# Patient Record
Sex: Male | Born: 1935 | Race: White | Hispanic: No | Marital: Married | Smoking: Never smoker
Health system: Southern US, Community
[De-identification: ages and names within clinical notes are randomized; demographics above are authoritative.]

## PROBLEM LIST (undated history)

## (undated) DIAGNOSIS — I779 Disorder of arteries and arterioles, unspecified: Secondary | ICD-10-CM

## (undated) DIAGNOSIS — N183 Chronic kidney disease, stage 3 unspecified: Secondary | ICD-10-CM

## (undated) DIAGNOSIS — I739 Peripheral vascular disease, unspecified: Secondary | ICD-10-CM

## (undated) DIAGNOSIS — I10 Essential (primary) hypertension: Secondary | ICD-10-CM

## (undated) DIAGNOSIS — E785 Hyperlipidemia, unspecified: Secondary | ICD-10-CM

## (undated) HISTORY — PX: PROSTATE SURGERY: SHX751

## (undated) HISTORY — PX: KNEE SURGERY: SHX244

---

## 2011-04-01 ENCOUNTER — Observation Stay (HOSPITAL_COMMUNITY)
Admission: EM | Admit: 2011-04-01 | Discharge: 2011-04-03 | Disposition: A | Discharge status: Home / Self Care | Payer: Medicare Other | Attending: Internal Medicine | Admitting: Internal Medicine

## 2011-04-01 ENCOUNTER — Emergency Department (HOSPITAL_COMMUNITY): Payer: Medicare Other

## 2011-04-01 DIAGNOSIS — I5040 Unspecified combined systolic (congestive) and diastolic (congestive) heart failure: Secondary | ICD-10-CM | POA: Insufficient documentation

## 2011-04-01 DIAGNOSIS — R0989 Other specified symptoms and signs involving the circulatory and respiratory systems: Secondary | ICD-10-CM | POA: Insufficient documentation

## 2011-04-01 DIAGNOSIS — I509 Heart failure, unspecified: Secondary | ICD-10-CM | POA: Insufficient documentation

## 2011-04-01 DIAGNOSIS — I4891 Unspecified atrial fibrillation: Secondary | ICD-10-CM | POA: Insufficient documentation

## 2011-04-01 DIAGNOSIS — M069 Rheumatoid arthritis, unspecified: Secondary | ICD-10-CM | POA: Insufficient documentation

## 2011-04-01 DIAGNOSIS — D649 Anemia, unspecified: Principal | ICD-10-CM | POA: Insufficient documentation

## 2011-04-01 DIAGNOSIS — R0789 Other chest pain: Secondary | ICD-10-CM | POA: Insufficient documentation

## 2011-04-01 DIAGNOSIS — I059 Rheumatic mitral valve disease, unspecified: Secondary | ICD-10-CM | POA: Insufficient documentation

## 2011-04-01 DIAGNOSIS — R0609 Other forms of dyspnea: Secondary | ICD-10-CM | POA: Insufficient documentation

## 2011-04-01 DIAGNOSIS — E785 Hyperlipidemia, unspecified: Secondary | ICD-10-CM | POA: Insufficient documentation

## 2011-04-01 DIAGNOSIS — I10 Essential (primary) hypertension: Secondary | ICD-10-CM | POA: Insufficient documentation

## 2011-04-01 DIAGNOSIS — R079 Chest pain, unspecified: Secondary | ICD-10-CM

## 2011-04-01 LAB — CBC
HCT: 24.6 % — ABNORMAL LOW (ref 39.0–52.0)
HCT: 23.1 % — ABNORMAL LOW (ref 39.0–52.0)
Hemoglobin: 8.2 g/dL — ABNORMAL LOW (ref 13.0–17.0)
Hemoglobin: 7.8 g/dL — ABNORMAL LOW (ref 13.0–17.0)
MCH: 34.3 pg — ABNORMAL HIGH (ref 26.0–34.0)
MCHC: 33.3 g/dL (ref 30.0–36.0)
MCV: 102.7 fL — ABNORMAL HIGH (ref 78.0–100.0)
RBC: 2.25 MIL/uL — ABNORMAL LOW (ref 4.22–5.81)
RDW: 19.3 % — ABNORMAL HIGH (ref 11.5–15.5)
RDW: 19.5 % — ABNORMAL HIGH (ref 11.5–15.5)
WBC: 5.5 10*3/uL (ref 4.0–10.5)

## 2011-04-01 LAB — COMPREHENSIVE METABOLIC PANEL
ALT: 18 U/L (ref 0–53)
AST: 17 U/L (ref 0–37)
Albumin: 3.2 g/dL — ABNORMAL LOW (ref 3.5–5.2)
Alkaline Phosphatase: 84 U/L (ref 39–117)
Calcium: 8.6 mg/dL (ref 8.4–10.5)
GFR calc Af Amer: 57 mL/min — ABNORMAL LOW (ref >60)
Potassium: 3.4 mEq/L — ABNORMAL LOW (ref 3.5–5.1)
Sodium: 141 mEq/L (ref 135–145)
Total Protein: 5.7 g/dL — ABNORMAL LOW (ref 6.0–8.3)

## 2011-04-01 LAB — DIFFERENTIAL
Basophils Absolute: 0.1 10*3/uL (ref 0.0–0.1)
Basophils Relative: 1 % (ref 0–1)
Eosinophils Relative: 10 % — ABNORMAL HIGH (ref 0–5)
Monocytes Absolute: 0.5 10*3/uL (ref 0.1–1.0)
Monocytes Relative: 10 % (ref 3–12)
Neutro Abs: 2.6 10*3/uL (ref 1.7–7.7)

## 2011-04-01 LAB — POCT CARDIAC MARKERS
CKMB, poc: <1 ng/mL — ABNORMAL LOW (ref 1.0–8.0)
Myoglobin, poc: 54.5 ng/mL (ref 12–200)
Troponin i, poc: <0.05 ng/mL (ref 0.00–0.09)

## 2011-04-01 LAB — CARDIAC PANEL(CRET KIN+CKTOT+MB+TROPI): Relative Index: INVALID (ref 0.0–2.5)

## 2011-04-01 LAB — PROTIME-INR
INR: 1.1 (ref 0.00–1.49)
Prothrombin Time: 14.4 seconds (ref 11.6–15.2)

## 2011-04-02 DIAGNOSIS — I059 Rheumatic mitral valve disease, unspecified: Secondary | ICD-10-CM

## 2011-04-02 LAB — CBC
HCT: 23.4 % — ABNORMAL LOW (ref 39.0–52.0)
MCH: 34.5 pg — ABNORMAL HIGH (ref 26.0–34.0)
MCV: 103.5 fL — ABNORMAL HIGH (ref 78.0–100.0)
RDW: 19.8 % — ABNORMAL HIGH (ref 11.5–15.5)
WBC: 5.1 10*3/uL (ref 4.0–10.5)

## 2011-04-02 LAB — COMPREHENSIVE METABOLIC PANEL
BUN: 18 mg/dL (ref 6–23)
Calcium: 8.6 mg/dL (ref 8.4–10.5)
Glucose, Bld: 77 mg/dL (ref 70–99)
Sodium: 143 mEq/L (ref 135–145)
Total Protein: 5.1 g/dL — ABNORMAL LOW (ref 6.0–8.3)

## 2011-04-02 LAB — CARDIAC PANEL(CRET KIN+CKTOT+MB+TROPI)
CK, MB: 1 ng/mL (ref 0.3–4.0)
Relative Index: INVALID (ref 0.0–2.5)
Total CK: 23 U/L (ref 7–232)
Troponin I: 0.02 ng/mL (ref 0.00–0.06)

## 2011-04-02 LAB — IRON AND TIBC
Iron: 47 ug/dL (ref 42–135)
Saturation Ratios: 25 % (ref 20–55)
TIBC: 185 ug/dL — ABNORMAL LOW (ref 215–435)
UIBC: 138 ug/dL

## 2011-04-02 LAB — LIPID PANEL
LDL Cholesterol: 43 mg/dL (ref 0–99)
Total CHOL/HDL Ratio: 2.8 RATIO
Triglycerides: 81 mg/dL (ref <150)
VLDL: 16 mg/dL (ref 0–40)

## 2011-04-02 LAB — FERRITIN: Ferritin: 446 ng/mL — ABNORMAL HIGH (ref 22–322)

## 2011-04-02 LAB — MAGNESIUM: Magnesium: 2.2 mg/dL (ref 1.5–2.5)

## 2011-04-02 LAB — VITAMIN B12: Vitamin B-12: 345 pg/mL (ref 211–911)

## 2011-04-03 LAB — CROSSMATCH
Antibody Screen: NEGATIVE
Unit division: 0

## 2011-04-03 LAB — HEMOGLOBIN AND HEMATOCRIT, BLOOD: Hemoglobin: 10.2 g/dL — ABNORMAL LOW (ref 13.0–17.0)

## 2011-04-03 NOTE — Consult Note (Signed)
NAMEGIOVONI, BUNCH             ACCOUNT NO.:  [id number]  MEDICAL RECORD NO.:  [id number]           PATIENT TYPE:  I  LOCATION:  2010                         FACILITY:  [facility]  PHYSICIAN:  Noralyn Pick. Eden Emms, MD, FACCDATE OF BIRTH:  12/14/1930  DATE OF CONSULTATION:  04/01/2011 DATE OF DISCHARGE:                                CONSULTATION   Mr. Sites is a 77 year old patient, new to the [facility].  He previously had lived in [place], [place].  He recently moved to [place] and sees Dr. Jacqulyn Bath as his primary.  About 8 weeks ago, he had angioedema and was treated with prednisone and Benadryl.  It is not clear to me what would particular ACE inhibitor he was on.  His medications that his wife had with him still has losartan in it.  This seems to have resolved.  He indicates having some heart problem in the past 6 months that required an 8-day hospital stay at [facility].  We do not have these records.  He did not have a heart cath. In talking to the patient, his memory is poor and almost appears that he has some dementia.  He came to the emergency room with fatigue and weakness.  He also complains of some atypical sharp central chest pressure and the pressure is nonexertional.  There is no associated diaphoresis.  His fatigue and dyspnea has been somewhat progressive over the last few weeks.  The patient denies syncope.  He was bradycardic in the emergency room, but is on a beta-blocker.  He denies palpitations.  He has had some sort of stomach surgery as a child, but denies knowing any history of anemia.  The ER doctor guaiac active stools and they were negative.  He had profound anemia in the emergency room with a hematocrit of 24.6 and an MCV of 102.9.  His 10-point review of systems is otherwise negative.  His past medical history is remarkable for hypertension, hypercholesterolemia, question of heart problems requiring hospitalization at  [facility].  The patient and his new wife are really uninformed regarding what it was and the patient has recent angioedema.  The patient was just remarried this Saturday.  He is a retired [employer] architect.  He lives in [place] now.  He indicates being able to walk on a regular basis prior to the last couple of weeks when he started to feel fatigued.  He does not smoke or drink.  He was in [place] previously.  FAMILY HISTORY:  Noncontributory.  There is no premature coronary artery disease in his mother's or father's side.  The patient describes an allergy to ether.  His medications which he brought with him included, 1. Toprol 25 mg a day. 2. Lipitor 20 mg a day. 3. Omeprazole 20 mg a day. 4. Losartan 15 mg unclear if he is still taking this after his bout of     angioedema. 5. Lasix 40 mg a day. 6. Meloxicam 15 mg a day. 7. Methotrexate 2.5 mg a day.  PHYSICAL EXAMINATION:  GENERAL:  Remarkable for an elderly male with poor memory. VITAL SIGNS:  His blood pressure is 138/72, pulse is 52 and sinus bradycardia, respiratory rate 14, afebrile. HEENT:  Unremarkable.  Carotids normal without bruit.  No lymphadenopathy, no thyromegaly, or JVP elevation. LUNGS:  Clear.  Good diaphragmatic motion.  No wheezing. CARDIAC:  S1 and S2 with a soft systolic murmur.  PMI palpable, but not particularly enlarged. ABDOMEN:  Somewhat tender.  He has a scar from his childhood surgery. No AAA palpable.  No hepatosplenomegaly or hepatojugular reflux, or tenderness. EXTREMITIES:  Distal pulses are intact.  No edema. NEURO:  Nonfocal. SKIN:  Warm and dry.  No muscular weakness.  EKG shows sinus rhythm at a rate of 44 and is otherwise totally normal.  Blood counts shows sodium 141, potassium 3.4, BUN 18, creatinine 1.45. Essentially normal LFTs.  Occult blood was negative.  Cardiac markers were negative.  White count 5.0, hematocrit 24.6, platelet count 356.  Chest x-ray shows  really no significant cardiac enlargement, normal lung fields, no evidence of congestive heart failure.  IMPRESSION: 1. Somewhat atypical chest pain with a normal EKG and negative cardiac     markers.  His pain may be result of his severe anemia.  We will     have to get records from [facility] regarding his hospital     stay there.  There is no evidence on exam or by chest x-ray of     significant decreased LV function.  It is unclear to me whether he     was on losartan and Lasix for cardiomyopathy or his hypertension.     We will rule him, get records from [facility].  We will     check a BNP and a sed rate and 2-D echocardiogram in the morning. 2. Severe anemia.  Workup per primary team.  He is guaiac negative and     has a high MCV.  He denies alcohol intake.  Again I suspect with     his severe degree of anemia, this is part of the reason for his     fatigue and shortness of breath and may be causing chest pain.  He     has had previous abdominal surgery as a child, but he does not say     that he has a history of chronic anemia. 3. Hypertension.  Given his history of angioedema, I suspect that any     high blood pressure can be treated with Norvasc.  We will stop his     Toprol due to his bradycardia.  I would avoid AV nodal blocking     drugs and ACE inhibitors given his angioedema.  Depending on his LV     function by echo, low-dose diuretics may be in order.  We would be happy to follow this patient along in the hospital to further workup his heart and records from [facility] will be important.    Noralyn Pick. Eden Emms, MD, [facility]    PCN/MEDQ  D:  04/01/2011  T:  04/02/2011  Job:  [id number]  Electronically Signed by Charlton Haws MD [facility] on 04/03/2011 03:36:43 PM

## 2011-04-07 NOTE — Discharge Summary (Signed)
[name], [name]             ACCOUNT NO.:  [id number]  MEDICAL RECORD NO.:  [id number]           PATIENT TYPE:  I  LOCATION:  2010                         FACILITY:  [facility]  PHYSICIAN:  Jeoffrey Massed, MD    DATE OF BIRTH:  02-12-1930  DATE OF ADMISSION:  04/01/2011 DATE OF DISCHARGE:                        DISCHARGE SUMMARY - REFERRING   PRIMARY DISCHARGE DIAGNOSES: 1. Severe anemia status post 2 units of packed red blood cell     transfusion. 2. Atypical chest pain. 3. Exertional dyspnea, probably secondary to symptomatic anemia. 4. Moderate mitral regurgitation.  SECONDARY DISCHARGE DIAGNOSES: 1. Congestive heart failure, combined systolic and diastolic     dysfunction. 2. Paroxysmal atrial fibrillation maintained on amiodarone.  Not on     anticoagulation prior to this hospitalization. 3. History of rheumatoid arthritis. 4. Hypertension. 5. Dyslipidemia. 6. Allergic rhinitis.  DISCHARGE MEDICATIONS: 1. Amiodarone 200 mg 1 tablet twice daily. 2. Aspirin 81 mg 1 tablet daily. 3. Lipitor 20 mg 1 tablet p.o. daily. 4. Cetirizine 10 mg 1 tablet daily. 5. Diphenhydramine 25 mg 1 tablet p.o. twice daily p.r.n. 6. Lasix 40 mg 1 tablet daily. 7. Lidocaine 2% 2 teaspoons p.o. q. hour p.r.n. for pain. 8. Losartan 50 mg 1 tablet daily. 9. Methotrexate 2.5 mg 6 tablets p.o. weekly. 10.Omeprazole 20 mg 1 tablet p.o. daily. 11.Folic acid 1 mg p.o. daily.  CONSULTATIONS: 1. Noralyn Pick. Eden Emms, MD, [facility] from [facility]. 2. John C. Madilyn Fireman, MD from [facility].  BRIEF HISTORY OF PRESENT ILLNESS:  The patient is a 77 year old male with a past medical history of proximal atrial fibrillation not on any anticoagulation prior to this hospitalization, however, in the past few months, was on Xarelto that was stopped, hypertension, rheumatoid arthritis, and dyslipidemia, came in to the hospital on April 01, 2011, with chest pain.  The patient was also found to be anemic.  He  also retrospectively gave a history of feeling weak and fatigued and having dyspnea on exertion.  He was then admitted to the Hospitalist Service for further evaluation and treatment.  For further details, please see the history and physical that was dictated by Dr. Toniann Fail on admission.  PERTINENT LABORATORY DATA: 1. Discharge hemoglobin is 10.2. 2. Anemia panel showed an iron of 47, total iron binding capacity of     185, percent saturation of 25, vitamin B12 of 345, folate of 3.2,     ferritin of 446. 3. Cardiac enzymes were cycled and these were negative. 4. LDL cholesterol was 43. 5. Fecal occult blood was negative.  PERTINENT RADIOLOGICAL STUDIES:  Portable chest x-ray shows minimal enlargement of the cardiac silhouette.  No pulmonary edema, pneumonia, or pleural effusion is seen.  No pneumothorax or skeletal lesion is seen.  A 2-D echocardiogram showed systolic function to be mildly to moderately reduced with an EF around 40-45%.  The posteroanterior lateral walls appeared moderately hypokinetic.  Features are consistent with pseudonormal left ventricular filling pattern, with concomitant abdominal relaxation and increased filling pressures.  There was at least moderate and possibly severe posteriorly directed eccentric mitral regurgitation.  BRIEF HOSPITAL COURSE: 1. Chest pain.  The patient was  admitted to the Hospitalist Service     with chest pain.  Cardiology also consulted on this patient.  The     patient was started on aspirin.  His beta-blocker was placed on     hold because of bradycardia.  His statin was continued.  It was     felt that the chest pain was atypical in nature.  A 2-D     echocardiogram was done which was noted as above.  Dr. Eden Emms from     Cardiology followed on this patient and he recommends further     workup to be done as an outpatient.  So at this time, all workup is     being deferred to the patient's primary cardiologist. 2. Anemia.   The patient presented to the hospital with a hemoglobin of     8.2 with an MCV of 102.9.  During his hospital course, his     hemoglobin did decrease to 7.8.  Because of the fact that he was     symptomatic and also of his significant cardiac history, he was     transfused 2 units of packed red blood cells.  Please note that his     FOBT and guaiac were negative on admission.  The patient's medical     records from [facility] was sought and upon     further review, it was found that he had a normal hemoglobin level     in December of last year.  Upon further questioning of this     patient, he claims that he gets intermittent hematochezia.  Also     further review of the documentation from [facility] also     showed that at one point, he was anticoagulated with Xarelto.     However, apparently this was discontinued a few weeks or months ago     for unknown reason.  The patient also is on nonsteroidal anti-     inflammatory medications for his arthritis.  An anemia panel was     done which shows a normal iron level, but a decreased folate level     of 3.2.  GI was consulted because of the quick and significant drop     in the past few months and the patient's history of intermittent     hematochezia.  Dr. Madilyn Fireman from [facility] consulted on this patient     and at this point, the patient wants to be discharged and he will     apparently follow up with Dr. Madilyn Fireman who will arrange for an     outpatient colonoscopy and EGD.  At this time, this patient has     been cleared by both GI and Cardiology to be discharged home.     Again, please note that there is no active blood loss and his     guaiac and a fecal occult blood both are negative. 3. CHF.  This is combined systolic and diastolic.  The patient is     clinically compensated.  He is to continue his Lasix and losartan.     His beta-blocker has been discontinued at the recommendation of     Cardiology for significant  bradycardia. 4. History of paroxysmal atrial fibrillation.  The patient was sinus     during this hospitalization.  Given his significant anemia and     question of perhaps a recent GI bleed, anticoagulation is obviously     not recommended at this point  in time.  He will just be discharged     on a baby aspirin for now.  He will follow up with GI and     Cardiology and after appropriate workup, the issue of     anticoagulation will then need to be revisited at that point.  For     now, we will defer this issue until the issue of anemia has been     adequate addressed. 5. History of rheumatoid arthritis.  This is stable.  The patient is     on methotrexate.  DISPOSITION:  The patient is felt stable to be discharged home.  FOLLOWUP INSTRUCTIONS: 1. The patient will follow up with Dr. Charlton Haws from [facility]     Cardiology.  He is to call and make an appointment within 1-2     weeks. 2. The patient is to follow up with Dr. Madilyn Fireman for an outpatient     endoscopy and colonoscopy.  He is to call [phone] in 2-3 weeks to     make an appointment. 3. The patient's spouse is at bedside and she tells me that she will     make arrangements for the patient to be seen by Dr. Holley Bouche,     who is also her primary physician for his primary care needs.  She     claims that she will make an appointment today and have the patient     see Dr. Tiburcio Pea in the next few days as well.  The patient has been instructed by me numerous times to seek immediate medical attention or talk to his doctors or present to the ED if he continues to have significant rectal bleeding.  He and his spouse both claim understanding.  TOTAL TIME SPENT:  Forty five minutes.     Jeoffrey Massed, MD     SG/MEDQ  D:  04/03/2011  T:  04/03/2011  Job:  [id number]  cc:   Holley Bouche, M.D. Noralyn Pick. Eden Emms, MD, [facility] C. Madilyn Fireman, M.D.  Electronically Signed by Jeoffrey Massed  on 04/07/2011 08:13:12 PM

## 2011-05-03 NOTE — H&P (Signed)
NAME:  [name], [name] NO.:  [id number]  MEDICAL RECORD NO.:  [id number]           PATIENT TYPE:  E  LOCATION:  [facility]                         FACILITY:  [facility]  PHYSICIAN:  Eduard Clos, MDDATE OF BIRTH:  May 20, 1930  DATE OF ADMISSION:  04/01/2011 DATE OF DISCHARGE:                             HISTORY & PHYSICAL   PRIMARY CARE PHYSICIAN:  At [facility].  The patient is planning to change PCP to [facility] [place].  PRIMARY CARDIOLOGIST:  Dr. Charlton Haws at [facility].  CHIEF COMPLAINT:  Chest pain.  HISTORY OF PRESENTING ILLNESS:  A 77 year old male with known history of hypertension, CHF unspecified, arthritis, is on methotrexate, hyperlipidemia, has been experiencing some chest pain since afternoon. The pain is retrosternal, pressure-like, sometimes radiating to left, increases with exertion.  Over the last few days, the patient has been also feeling fatigued and weak with dizziness when he tries to exert. The patient did not lose consciousness.  He said he almost passed out a couple of times.  Denies any cough or phlegm.  Denies any fever or chills.  Denies any abdominal pain, dysuria, discharge, or diarrhea. Denies any headache or visual symptoms.  Denies any focal deficit.  The ER physician had a consult with on-call cardiologist who advised medical admission at this time and also we need further workup on his anemia.  The patient does not recall having any in the stools or black stools. The patient's guaiac is negative at this time.  PAST MEDICAL HISTORY: 1. Hypertension. 2. CHF unspecified. 3. Hyperlipidemia. 4. Allergic rhinitis. 5. Arthritis.  PAST SURGICAL HISTORY:  The patient has had knee replacement, cataract surgery, and facial surgery for injury during football game when he was a teenager.  The patient denies having any stents in the heart or surgery for the heart or gallbladder or appendix.  The patient did  have surgery for prostate cancer.  MEDICATIONS PER ADMISSION: 1. Furosemide 40 mg daily. 2. Meloxicam 15 mg daily. 3. Omeprazole 20 mg daily. 4. Losartan 50 mg daily. 5. Metoprolol XL 25 mg daily. 6. Atorvastatin 20 mg daily. 7. Methotrexate 2.5 mg 6 tablets weekly. 8. Cetirizine 10 mg daily. 9. Diphenhydramine 25 mg 1 tablet twice daily. 10.Lidocaine 2% p.o.  ALLERGIES:  No known drug allergies.  FAMILY HISTORY:  Nothing contributory.  SOCIAL HISTORY:  The patient quit smoking 15 years ago.  Denies any alcohol or drug abuse.  He is married.  He is a full code.  REVIEW OF SYSTEMS:  As per history of present illness, nothing else significant.  PHYSICAL EXAMINATION:  GENERAL:  The patient examined at bedside, not in acute distress.  Chest pain is largely improved at this time but still has mild. VITAL SIGNS:  Blood pressure 123/60, pulse 44 per minute, sinus bradycardia, temperature 97.4, respirations 18 per minute, O2 sat 100%. HEENT: Anicteric.  No pallor.  No discharge from ears, eyes, nose, or mouth. CHEST:  Bilateral air entry present.  No rhonchi.  No crepitation. HEART:  S1, S2 heard. ABDOMEN:  Soft, nontender.  Bowel sounds heard. CNS:  The patient is alert, awake, and oriented to  time, place, and person, moves upper and lower extremities, 5/5. EXTREMITIES:  Peripheral pulses felt.  No edema.  LABORATORY DATA:  EKG shows normal sinus rhythm with nonspecific ST-T changes with heart rate of 44 beats per minute.  Chest x-ray shows minimal enlargement of cardiac silhouette.  No pulmonary edema, pneumonia, or pleural effusion is seen.  No pneumothorax or skeletal lesion is seen.  CBC:  WBCs 5, hemoglobin 8.2, hematocrit is 24.6, platelets 56, MCV is 102.9.  Eosinophil is 10%.  PT/INR 14.4 and 1.1. Complete metabolic panel; sodium 141, potassium 3.4, chloride 111, carbon dioxide 26, glucose 112, BUN 18, creatinine 1.4, total bilirubin 7.4, alkaline phos is 84, AST 17,  ALT 18, total protein is 5.7, albumin 3.2, calcium is 8.6, CK-MB less than 1, troponin-I less than 0.05, hemoglobin 61.2, fecal occult blood is negative.  ASSESSMENT: 1. Chest pain at this time concerning for unstable angina. 2. Dizziness with sinus bradycardia and anemia. 3. Sinus bradycardia. 4. Macrocytic anemia with fecal blood is negative. 5. History of hypertension. 6. History of allergic rhinitis. 7. Eosinophilia. 8. Previous history of cigarette smoking. 9. Arthritis.  PLAN: 1. Admit the patient to telemetry. 2. For chest pain we will cycle cardiac markers.  The patient on     aspirin, nitroglycerin paste.  We will get a 2-D echo and also     consult Cardiology. 3. For his dizziness and at this time the patient's bradycardia, we     will hold his metoprolol and will check orthostatics.  At this time     the patient also has anemia which could be contributing to his     dizziness.  Guaiac is negative.  I am going to recheck a CBC in     another few hours to make sure there is no significant drop.  Also,     check anemia panel.  We will type and screen 2 units and hold.     Probably the patient may need a GI workup also as the patient is on     antiinflammatory medication for his arthritis. 4. Eosinophilia with a history of allergic rhinitis.  We will continue     his cetirizine for now. 5. He is on methotrexate and does not know exactly what he is taking     that for, he is saying probably for arthritis.  We need to get his     medical records from his PCP. 6. Further recommendation will be based on test order, clinic course,     and consult recommendations.     Eduard Clos, MD     ANK/MEDQ  D:  04/01/2011  T:  04/01/2011  Job:  [id number]  cc:   Noralyn Pick. Eden Emms, MD, [facility]  Electronically Signed by Midge Minium MD on 05/03/2011 08:04:20 AM

## 2011-05-06 NOTE — Consult Note (Signed)
  [name], [name]             ACCOUNT NO.:  [id number]  MEDICAL RECORD NO.:  [id number]           PATIENT TYPE:  I  LOCATION:  2010                         FACILITY:  [facility]  PHYSICIAN:  Halvor Behrend C. Madilyn Fireman, M.D.    DATE OF BIRTH:  December 05, 1930  DATE OF CONSULTATION:  04/03/2011 DATE OF DISCHARGE:  04/03/2011                                CONSULTATION   REASON FOR CONSULTATION:  Anemia with reported small-volume hematochezia.  HISTORY OF PRESENT ILLNESS:  The patient is a 77 year old white male with hypertension, arthritis on methotrexate who presented with chest pain, was found to have a hemoglobin of 7.8.  He had heme-negative stools who states that he has had intermittent bright red blood per rectum.  He thinks he has had a colonoscopy in the past but is not sure of the detail and is sure that it has been over 5 years ago.  He reportedly had a hemoglobin of 14 a few months ago.  He denies any nausea, vomiting, dysphagia, anorexia, change in stool caliber.  He has no family history of GI malignancy.  He has been cleared from cardiac standpoint for discharge and outpatient followup.  He has received 2 units of packed red blood cells while in the hospital.  PAST MEDICAL HISTORY:  Hypertension, congestive heart failure, hyperlipidemia, allergic rhinitis, arthritis.  PAST SURGICAL HISTORY:  Knee replacement, cataract surgery, facial surgery.  MEDICATIONS:  Furosemide, meloxicam, omeprazole, losartan, metoprolol, atorvastatin, methotrexate.  ALLERGIES:  None.  FAMILY HISTORY:  Noncontributory.  SOCIAL HISTORY:  The patient denies tobacco use for the last 15 years. He does not drink alcohol.  PHYSICAL EXAMINATION:  GENERAL:  Well-developed, well-nourished white male, in no acute distress. HEART:  Regular rate and rhythm without murmurs. LUNGS:  Clear. ABDOMEN:  Soft, nondistended with normoactive bowel sounds.  No hepatosplenomegaly, mass, or guarding.  LABS:  Sed rate 78,  hemoglobin 8.2 following 7.8, MCV 102.7, platelets 360,000.  BUN 18, creatinine 1.45.  IMPRESSION:  Anemia with significant drop in hemoglobin without overt evidence of GI bleeding with intermittent hematochezia and no recent colon screening.  He also takes meloxicam for arthritis, but takes proton pump inhibitor as well.  PLAN:  The patient should have colonoscopy.  He reported to have this done as an outpatient and if cleared from medical standpoint, would like to go home.  We will set up outpatient followup for colonoscopy and possibly EGD.          ______________________________ Everardo All. Madilyn Fireman, M.D.     JCH/MEDQ  D:  04/03/2011  T:  04/03/2011  Job:  [id number]  Electronically Signed by Dorena Cookey M.D. on 05/06/2011 08:52:24 PM

## 2011-08-18 ENCOUNTER — Inpatient Hospital Stay (HOSPITAL_COMMUNITY)
Admission: AD | Admit: 2011-08-18 | Discharge: 2011-09-05 | DRG: 640 | Disposition: A | Discharge status: Home / Self Care | Payer: Medicare Other | Source: Ambulatory Visit | Attending: Internal Medicine | Admitting: Internal Medicine

## 2011-08-18 ENCOUNTER — Inpatient Hospital Stay (HOSPITAL_COMMUNITY): Payer: Medicare Other

## 2011-08-18 DIAGNOSIS — N189 Chronic kidney disease, unspecified: Secondary | ICD-10-CM | POA: Diagnosis present

## 2011-08-18 DIAGNOSIS — R300 Dysuria: Secondary | ICD-10-CM | POA: Diagnosis present

## 2011-08-18 DIAGNOSIS — R7401 Elevation of levels of liver transaminase levels: Secondary | ICD-10-CM | POA: Diagnosis present

## 2011-08-18 DIAGNOSIS — R5381 Other malaise: Secondary | ICD-10-CM | POA: Diagnosis present

## 2011-08-18 DIAGNOSIS — I5032 Chronic diastolic (congestive) heart failure: Secondary | ICD-10-CM | POA: Diagnosis present

## 2011-08-18 DIAGNOSIS — S3994XA Unspecified injury of external genitals, initial encounter: Secondary | ICD-10-CM | POA: Diagnosis not present

## 2011-08-18 DIAGNOSIS — I509 Heart failure, unspecified: Secondary | ICD-10-CM | POA: Diagnosis present

## 2011-08-18 DIAGNOSIS — S39848A Other specified injuries of external genitals, initial encounter: Secondary | ICD-10-CM | POA: Diagnosis not present

## 2011-08-18 DIAGNOSIS — N179 Acute kidney failure, unspecified: Secondary | ICD-10-CM | POA: Diagnosis present

## 2011-08-18 DIAGNOSIS — I129 Hypertensive chronic kidney disease with stage 1 through stage 4 chronic kidney disease, or unspecified chronic kidney disease: Secondary | ICD-10-CM | POA: Diagnosis present

## 2011-08-18 DIAGNOSIS — J189 Pneumonia, unspecified organism: Secondary | ICD-10-CM | POA: Diagnosis present

## 2011-08-18 DIAGNOSIS — M069 Rheumatoid arthritis, unspecified: Secondary | ICD-10-CM | POA: Diagnosis present

## 2011-08-18 DIAGNOSIS — J984 Other disorders of lung: Secondary | ICD-10-CM | POA: Diagnosis present

## 2011-08-18 DIAGNOSIS — R7402 Elevation of levels of lactic acid dehydrogenase (LDH): Secondary | ICD-10-CM | POA: Diagnosis present

## 2011-08-18 DIAGNOSIS — N170 Acute kidney failure with tubular necrosis: Secondary | ICD-10-CM | POA: Diagnosis present

## 2011-08-18 DIAGNOSIS — Z8546 Personal history of malignant neoplasm of prostate: Secondary | ICD-10-CM

## 2011-08-18 DIAGNOSIS — D638 Anemia in other chronic diseases classified elsewhere: Secondary | ICD-10-CM | POA: Diagnosis present

## 2011-08-18 DIAGNOSIS — E785 Hyperlipidemia, unspecified: Secondary | ICD-10-CM | POA: Diagnosis present

## 2011-08-18 DIAGNOSIS — X58XXXA Exposure to other specified factors, initial encounter: Secondary | ICD-10-CM | POA: Diagnosis not present

## 2011-08-18 DIAGNOSIS — I4891 Unspecified atrial fibrillation: Secondary | ICD-10-CM | POA: Diagnosis present

## 2011-08-18 LAB — CARDIAC PANEL(CRET KIN+CKTOT+MB+TROPI)
Relative Index: INVALID (ref 0.0–2.5)
Troponin I: <0.3 ng/mL (ref <0.30)

## 2011-08-18 LAB — VITAMIN B12: Vitamin B-12: 1045 pg/mL — ABNORMAL HIGH (ref 211–911)

## 2011-08-18 LAB — URINALYSIS, ROUTINE W REFLEX MICROSCOPIC
Glucose, UA: NEGATIVE mg/dL
Hgb urine dipstick: NEGATIVE
Ketones, ur: NEGATIVE mg/dL
Leukocytes, UA: NEGATIVE
Protein, ur: NEGATIVE mg/dL
pH: 6 (ref 5.0–8.0)

## 2011-08-18 LAB — RAPID URINE DRUG SCREEN, HOSP PERFORMED
Amphetamines: NOT DETECTED
Barbiturates: NOT DETECTED
Benzodiazepines: NOT DETECTED
Cocaine: NOT DETECTED
Tetrahydrocannabinol: NOT DETECTED

## 2011-08-18 LAB — CBC
HCT: 26.5 % — ABNORMAL LOW (ref 39.0–52.0)
Hemoglobin: 8.8 g/dL — ABNORMAL LOW (ref 13.0–17.0)
MCV: 101.1 fL — ABNORMAL HIGH (ref 78.0–100.0)
RBC: 2.62 MIL/uL — ABNORMAL LOW (ref 4.22–5.81)
RDW: 15.8 % — ABNORMAL HIGH (ref 11.5–15.5)
WBC: 5.1 10*3/uL (ref 4.0–10.5)

## 2011-08-18 LAB — COMPREHENSIVE METABOLIC PANEL
ALT: 123 U/L — ABNORMAL HIGH (ref 0–53)
AST: 99 U/L — ABNORMAL HIGH (ref 0–37)
Alkaline Phosphatase: 223 U/L — ABNORMAL HIGH (ref 39–117)
CO2: 31 mEq/L (ref 19–32)
Calcium: 13.8 mg/dL (ref 8.4–10.5)
Chloride: 98 mEq/L (ref 96–112)
GFR calc Af Amer: 39 mL/min — ABNORMAL LOW (ref >60)
GFR calc non Af Amer: 32 mL/min — ABNORMAL LOW (ref >60)
Glucose, Bld: 94 mg/dL (ref 70–99)
Sodium: 137 mEq/L (ref 135–145)
Total Bilirubin: 0.8 mg/dL (ref 0.3–1.2)

## 2011-08-18 LAB — TSH: TSH: 0.647 u[IU]/mL (ref 0.350–4.500)

## 2011-08-18 LAB — SEDIMENTATION RATE: Sed Rate: 119 mm/hr — ABNORMAL HIGH (ref 0–16)

## 2011-08-19 ENCOUNTER — Inpatient Hospital Stay (HOSPITAL_COMMUNITY): Payer: Medicare Other

## 2011-08-19 LAB — CARDIAC PANEL(CRET KIN+CKTOT+MB+TROPI)
Total CK: 16 U/L (ref 7–232)
Troponin I: <0.3 ng/mL (ref <0.30)

## 2011-08-19 LAB — TECHNOLOGIST SMEAR REVIEW

## 2011-08-19 LAB — COMPREHENSIVE METABOLIC PANEL
ALT: 100 U/L — ABNORMAL HIGH (ref 0–53)
AST: 72 U/L — ABNORMAL HIGH (ref 0–37)
Alkaline Phosphatase: 205 U/L — ABNORMAL HIGH (ref 39–117)
BUN: 36 mg/dL — ABNORMAL HIGH (ref 6–23)
CO2: 29 mEq/L (ref 19–32)
Calcium: 12.7 mg/dL — ABNORMAL HIGH (ref 8.4–10.5)
Chloride: 103 mEq/L (ref 96–112)
Creatinine, Ser: 1.63 mg/dL — ABNORMAL HIGH (ref 0.50–1.35)
Creatinine, Ser: 1.56 mg/dL — ABNORMAL HIGH (ref 0.50–1.35)
GFR calc Af Amer: 50 mL/min — ABNORMAL LOW (ref >60)
GFR calc non Af Amer: 41 mL/min — ABNORMAL LOW (ref >60)
Glucose, Bld: 106 mg/dL — ABNORMAL HIGH (ref 70–99)
Sodium: 140 mEq/L (ref 135–145)
Total Bilirubin: 0.8 mg/dL (ref 0.3–1.2)
Total Protein: 5.9 g/dL — ABNORMAL LOW (ref 6.0–8.3)

## 2011-08-19 LAB — CBC
HCT: 26.9 % — ABNORMAL LOW (ref 39.0–52.0)
Hemoglobin: 9.2 g/dL — ABNORMAL LOW (ref 13.0–17.0)
MCV: 100 fL (ref 78.0–100.0)
RBC: 2.69 MIL/uL — ABNORMAL LOW (ref 4.22–5.81)
RDW: 15.7 % — ABNORMAL HIGH (ref 11.5–15.5)
WBC: 5.2 10*3/uL (ref 4.0–10.5)

## 2011-08-19 MED ORDER — TECHNETIUM TC 99M MEDRONATE IV KIT
24.1000 | PACK | Freq: Once | INTRAVENOUS | Status: AC | PRN
  Administered 2011-08-19: 24.1 via INTRAVENOUS

## 2011-08-20 ENCOUNTER — Inpatient Hospital Stay (HOSPITAL_COMMUNITY): Payer: Medicare Other

## 2011-08-20 DIAGNOSIS — D649 Anemia, unspecified: Secondary | ICD-10-CM

## 2011-08-20 LAB — CBC
HCT: 23.3 % — ABNORMAL LOW (ref 39.0–52.0)
Hemoglobin: 8.1 g/dL — ABNORMAL LOW (ref 13.0–17.0)
Hemoglobin: 8.1 g/dL — ABNORMAL LOW (ref 13.0–17.0)
MCH: 33.3 pg (ref 26.0–34.0)
MCH: 34 pg (ref 26.0–34.0)
MCHC: 34.2 g/dL (ref 30.0–36.0)
MCHC: 34.3 g/dL (ref 30.0–36.0)
MCV: 99.6 fL (ref 78.0–100.0)
MCV: 100.4 fL — ABNORMAL HIGH (ref 78.0–100.0)
Platelets: 288 10*3/uL (ref 150–400)
Platelets: 266 10*3/uL (ref 150–400)
RBC: 2.43 MIL/uL — ABNORMAL LOW (ref 4.22–5.81)
RDW: 15.6 % — ABNORMAL HIGH (ref 11.5–15.5)
RDW: 15.5 % (ref 11.5–15.5)
WBC: 4.5 10*3/uL (ref 4.0–10.5)

## 2011-08-20 LAB — COMPREHENSIVE METABOLIC PANEL
ALT: 84 U/L — ABNORMAL HIGH (ref 0–53)
AST: 55 U/L — ABNORMAL HIGH (ref 0–37)
CO2: 28 mEq/L (ref 19–32)
Calcium: 12.5 mg/dL — ABNORMAL HIGH (ref 8.4–10.5)
Chloride: 104 mEq/L (ref 96–112)
Creatinine, Ser: 1.5 mg/dL — ABNORMAL HIGH (ref 0.50–1.35)
GFR calc Af Amer: 55 mL/min — ABNORMAL LOW (ref >60)
GFR calc non Af Amer: 45 mL/min — ABNORMAL LOW (ref >60)
Glucose, Bld: 92 mg/dL (ref 70–99)
Sodium: 139 mEq/L (ref 135–145)
Total Bilirubin: 0.8 mg/dL (ref 0.3–1.2)

## 2011-08-20 LAB — UIFE/LIGHT CHAINS/TP QN, 24-HR UR
Beta, Urine: DETECTED — AB
Free Kappa Lt Chains,Ur: 5.32 mg/dL — ABNORMAL HIGH (ref 0.14–2.42)
Free Lambda Lt Chains,Ur: 1.26 mg/dL — ABNORMAL HIGH (ref 0.02–0.67)
Gamma Globulin, Urine: DETECTED — AB

## 2011-08-20 LAB — URINALYSIS, ROUTINE W REFLEX MICROSCOPIC
Bilirubin Urine: NEGATIVE
Glucose, UA: NEGATIVE mg/dL
Ketones, ur: NEGATIVE mg/dL
Leukocytes, UA: NEGATIVE
Protein, ur: NEGATIVE mg/dL
pH: 7 (ref 5.0–8.0)

## 2011-08-20 LAB — DIFFERENTIAL
Eosinophils Absolute: 0.3 10*3/uL (ref 0.0–0.7)
Eosinophils Relative: 6 % — ABNORMAL HIGH (ref 0–5)
Lymphocytes Relative: 14 % (ref 12–46)
Lymphs Abs: 0.7 10*3/uL (ref 0.7–4.0)
Monocytes Absolute: 0.1 10*3/uL (ref 0.1–1.0)

## 2011-08-20 LAB — HEPATITIS PANEL, ACUTE
HCV Ab: NEGATIVE
Hep A IgM: NEGATIVE
Hep B C IgM: NEGATIVE

## 2011-08-20 LAB — PROTEIN ELECTROPHORESIS, SERUM
Albumin ELP: 55.3 % — ABNORMAL LOW (ref 55.8–66.1)
Alpha-1-Globulin: 7.8 % — ABNORMAL HIGH (ref 2.9–4.9)
Beta 2: 6.3 % (ref 3.2–6.5)

## 2011-08-21 ENCOUNTER — Inpatient Hospital Stay (HOSPITAL_COMMUNITY): Payer: Medicare Other

## 2011-08-21 LAB — UIFE/LIGHT CHAINS/TP QN, 24-HR UR
Alpha 2, Urine: DETECTED — AB
Beta, Urine: DETECTED — AB
Free Kappa Lt Chains,Ur: 8.68 mg/dL — ABNORMAL HIGH (ref 0.14–2.42)
Free Kappa/Lambda Ratio: 7.17 ratio (ref 2.04–10.37)
Free Lambda Lt Chains,Ur: 1.21 mg/dL — ABNORMAL HIGH (ref 0.02–0.67)
Free Lt Chn Excr Rate: 236.53 mg/d
Time: 24 hours
Total Protein, Urine: 10.8 mg/dL

## 2011-08-21 LAB — CBC
MCH: 34.1 pg — ABNORMAL HIGH (ref 26.0–34.0)
MCHC: 34.5 g/dL (ref 30.0–36.0)
Platelets: 241 10*3/uL (ref 150–400)
RDW: 15.5 % (ref 11.5–15.5)

## 2011-08-21 LAB — PHOSPHORUS: Phosphorus: 3.4 mg/dL (ref 2.3–4.6)

## 2011-08-21 LAB — ERYTHROPOIETIN: Erythropoietin: 30.6 m[IU]/mL (ref 2.6–34.0)

## 2011-08-21 LAB — BASIC METABOLIC PANEL
Calcium: 12.2 mg/dL — ABNORMAL HIGH (ref 8.4–10.5)
GFR calc Af Amer: 55 mL/min — ABNORMAL LOW (ref >60)
GFR calc non Af Amer: 46 mL/min — ABNORMAL LOW (ref >60)
Glucose, Bld: 90 mg/dL (ref 70–99)
Sodium: 140 mEq/L (ref 135–145)

## 2011-08-21 LAB — KAPPA/LAMBDA LIGHT CHAINS: Kappa free light chain: 2.48 mg/dL — ABNORMAL HIGH (ref 0.33–1.94)

## 2011-08-21 LAB — C-REACTIVE PROTEIN: CRP: 6.8 mg/dL — ABNORMAL HIGH (ref <0.60)

## 2011-08-21 LAB — SAVE SMEAR

## 2011-08-21 LAB — EPSTEIN-BARR VIRUS EARLY D ANTIGEN ANTIBODY, IGG: EBV EA IgG: 0.12 {ISR}

## 2011-08-22 LAB — CROSSMATCH: Unit division: 0

## 2011-08-22 LAB — URINALYSIS, ROUTINE W REFLEX MICROSCOPIC
Glucose, UA: NEGATIVE mg/dL
Hgb urine dipstick: NEGATIVE
Ketones, ur: NEGATIVE mg/dL
Protein, ur: NEGATIVE mg/dL
Urobilinogen, UA: 0.2 mg/dL (ref 0.0–1.0)

## 2011-08-22 LAB — CALCIUM, URINE, 24 HOUR
Calcium, Ur: 20 mg/dL
Urine Total Volume-UCA24: 1800 mL

## 2011-08-22 LAB — COMPREHENSIVE METABOLIC PANEL
BUN: 23 mg/dL (ref 6–23)
CO2: 26 mEq/L (ref 19–32)
Calcium: 11 mg/dL — ABNORMAL HIGH (ref 8.4–10.5)
Chloride: 106 mEq/L (ref 96–112)
Creatinine, Ser: 1.4 mg/dL — ABNORMAL HIGH (ref 0.50–1.35)
GFR calc Af Amer: 59 mL/min — ABNORMAL LOW (ref >60)
GFR calc non Af Amer: 49 mL/min — ABNORMAL LOW (ref >60)
Glucose, Bld: 93 mg/dL (ref 70–99)
Total Bilirubin: 0.8 mg/dL (ref 0.3–1.2)

## 2011-08-22 LAB — CBC
Hemoglobin: 8.8 g/dL — ABNORMAL LOW (ref 13.0–17.0)
MCH: 33.5 pg (ref 26.0–34.0)
Platelets: 222 10*3/uL (ref 150–400)
RBC: 2.63 MIL/uL — ABNORMAL LOW (ref 4.22–5.81)

## 2011-08-23 ENCOUNTER — Inpatient Hospital Stay (HOSPITAL_COMMUNITY): Payer: Medicare Other

## 2011-08-23 LAB — IRON AND TIBC: Iron: 27 ug/dL — ABNORMAL LOW (ref 42–135)

## 2011-08-23 LAB — CBC
HCT: 26.1 % — ABNORMAL LOW (ref 39.0–52.0)
MCHC: 34.5 g/dL (ref 30.0–36.0)
MCV: 97 fL (ref 78.0–100.0)
Platelets: 217 10*3/uL (ref 150–400)
RDW: 16.6 % — ABNORMAL HIGH (ref 11.5–15.5)
WBC: 5.4 10*3/uL (ref 4.0–10.5)

## 2011-08-23 LAB — BASIC METABOLIC PANEL
BUN: 20 mg/dL (ref 6–23)
Calcium: 10.2 mg/dL (ref 8.4–10.5)
Creatinine, Ser: 1.25 mg/dL (ref 0.50–1.35)
GFR calc Af Amer: >60 mL/min (ref >60)
GFR calc non Af Amer: 56 mL/min — ABNORMAL LOW (ref >60)

## 2011-08-24 LAB — BASIC METABOLIC PANEL
BUN: 21 mg/dL (ref 6–23)
CO2: 22 mEq/L (ref 19–32)
Calcium: 9.4 mg/dL (ref 8.4–10.5)
Chloride: 105 mEq/L (ref 96–112)
Creatinine, Ser: 1.34 mg/dL (ref 0.50–1.35)
Glucose, Bld: 100 mg/dL — ABNORMAL HIGH (ref 70–99)

## 2011-08-24 LAB — MAGNESIUM: Magnesium: 2.1 mg/dL (ref 1.5–2.5)

## 2011-08-24 LAB — CBC
HCT: 27.3 % — ABNORMAL LOW (ref 39.0–52.0)
Hemoglobin: 9.1 g/dL — ABNORMAL LOW (ref 13.0–17.0)
MCH: 33 pg (ref 26.0–34.0)
MCV: 98.9 fL (ref 78.0–100.0)
Platelets: 243 10*3/uL (ref 150–400)
RBC: 2.76 MIL/uL — ABNORMAL LOW (ref 4.22–5.81)
WBC: 6.4 10*3/uL (ref 4.0–10.5)

## 2011-08-24 LAB — CULTURE, BLOOD (ROUTINE X 2)
Culture  Setup Time: 201208282136
Culture  Setup Time: 201208282137

## 2011-08-25 LAB — URINE CULTURE: Colony Count: >=100000

## 2011-08-25 LAB — BETA 2 MICROGLOBULIN, SERUM: Beta-2 Microglobulin: 7.07 mg/L — ABNORMAL HIGH (ref 1.01–1.73)

## 2011-08-25 LAB — BASIC METABOLIC PANEL
BUN: 22 mg/dL (ref 6–23)
Creatinine, Ser: 1.18 mg/dL (ref 0.50–1.35)
GFR calc Af Amer: >60 mL/min (ref >60)
GFR calc non Af Amer: 60 mL/min — ABNORMAL LOW (ref >60)
Potassium: 3.5 mEq/L (ref 3.5–5.1)

## 2011-08-25 LAB — CBC
HCT: 25.2 % — ABNORMAL LOW (ref 39.0–52.0)
MCHC: 34.1 g/dL (ref 30.0–36.0)
Platelets: 238 10*3/uL (ref 150–400)
RDW: 16.3 % — ABNORMAL HIGH (ref 11.5–15.5)

## 2011-08-26 LAB — CBC
MCH: 33.1 pg (ref 26.0–34.0)
MCHC: 34.3 g/dL (ref 30.0–36.0)
Platelets: 252 10*3/uL (ref 150–400)

## 2011-08-26 LAB — IMMUNOFIXATION ELECTROPHORESIS
IgG (Immunoglobin G), Serum: 596 mg/dL — ABNORMAL LOW (ref 650–1600)
Total Protein ELP: 5.1 g/dL — ABNORMAL LOW (ref 6.0–8.3)

## 2011-08-26 LAB — BASIC METABOLIC PANEL
Calcium: 9.1 mg/dL (ref 8.4–10.5)
GFR calc Af Amer: >60 mL/min (ref >60)
GFR calc non Af Amer: 59 mL/min — ABNORMAL LOW (ref >60)
Sodium: 136 mEq/L (ref 135–145)

## 2011-08-26 LAB — PROTEIN ELECTROPHORESIS, SERUM
Alpha-2-Globulin: 15.4 % — ABNORMAL HIGH (ref 7.1–11.8)
Gamma Globulin: 10.7 % — ABNORMAL LOW (ref 11.1–18.8)
M-Spike, %: NOT DETECTED g/dL
Total Protein ELP: 5.1 g/dL — ABNORMAL LOW (ref 6.0–8.3)

## 2011-08-27 DIAGNOSIS — D649 Anemia, unspecified: Secondary | ICD-10-CM

## 2011-08-27 DIAGNOSIS — D472 Monoclonal gammopathy: Secondary | ICD-10-CM

## 2011-08-27 LAB — CBC
HCT: 24.2 % — ABNORMAL LOW (ref 39.0–52.0)
Hemoglobin: 8.3 g/dL — ABNORMAL LOW (ref 13.0–17.0)
MCH: 33.5 pg (ref 26.0–34.0)
MCHC: 34.3 g/dL (ref 30.0–36.0)
MCV: 97.6 fL (ref 78.0–100.0)
RBC: 2.48 MIL/uL — ABNORMAL LOW (ref 4.22–5.81)

## 2011-08-27 LAB — COMPREHENSIVE METABOLIC PANEL
ALT: 133 U/L — ABNORMAL HIGH (ref 0–53)
BUN: 22 mg/dL (ref 6–23)
CO2: 22 mEq/L (ref 19–32)
Calcium: 10.3 mg/dL (ref 8.4–10.5)
Creatinine, Ser: 1.22 mg/dL (ref 0.50–1.35)
GFR calc Af Amer: >60 mL/min (ref >60)
GFR calc non Af Amer: 57 mL/min — ABNORMAL LOW (ref >60)
Glucose, Bld: 89 mg/dL (ref 70–99)
Sodium: 139 mEq/L (ref 135–145)
Total Protein: 5.4 g/dL — ABNORMAL LOW (ref 6.0–8.3)

## 2011-08-27 LAB — GAMMA GT: GGT: 541 U/L — ABNORMAL HIGH (ref 7–51)

## 2011-08-28 ENCOUNTER — Other Ambulatory Visit (HOSPITAL_COMMUNITY): Payer: Medicare Other

## 2011-08-28 ENCOUNTER — Inpatient Hospital Stay (HOSPITAL_COMMUNITY): Payer: Medicare Other

## 2011-08-28 ENCOUNTER — Other Ambulatory Visit: Payer: Self-pay | Admitting: Diagnostic Radiology

## 2011-08-28 LAB — COMPREHENSIVE METABOLIC PANEL
Alkaline Phosphatase: 404 U/L — ABNORMAL HIGH (ref 39–117)
BUN: 23 mg/dL (ref 6–23)
CO2: 23 mEq/L (ref 19–32)
GFR calc Af Amer: >60 mL/min (ref >60)
GFR calc non Af Amer: >60 mL/min (ref >60)
Glucose, Bld: 90 mg/dL (ref 70–99)
Potassium: 4.2 mEq/L (ref 3.5–5.1)
Total Bilirubin: 0.7 mg/dL (ref 0.3–1.2)
Total Protein: 5.6 g/dL — ABNORMAL LOW (ref 6.0–8.3)

## 2011-08-28 LAB — CBC
MCHC: 34.7 g/dL (ref 30.0–36.0)
MCV: 96.4 fL (ref 78.0–100.0)
RBC: 2.48 MIL/uL — ABNORMAL LOW (ref 4.22–5.81)
WBC: 3.7 10*3/uL — ABNORMAL LOW (ref 4.0–10.5)

## 2011-08-29 ENCOUNTER — Inpatient Hospital Stay (HOSPITAL_COMMUNITY): Payer: Medicare Other

## 2011-08-29 LAB — COMPREHENSIVE METABOLIC PANEL
ALT: 84 U/L — ABNORMAL HIGH (ref 0–53)
AST: 35 U/L (ref 0–37)
Albumin: 2.3 g/dL — ABNORMAL LOW (ref 3.5–5.2)
Alkaline Phosphatase: 348 U/L — ABNORMAL HIGH (ref 39–117)
Chloride: 106 mEq/L (ref 96–112)
Potassium: 4 mEq/L (ref 3.5–5.1)
Sodium: 138 mEq/L (ref 135–145)
Total Bilirubin: 0.5 mg/dL (ref 0.3–1.2)

## 2011-08-29 LAB — CBC
HCT: 22.9 % — ABNORMAL LOW (ref 39.0–52.0)
Platelets: 270 10*3/uL (ref 150–400)
RDW: 16.1 % — ABNORMAL HIGH (ref 11.5–15.5)
WBC: 3 10*3/uL — ABNORMAL LOW (ref 4.0–10.5)

## 2011-08-29 NOTE — Progress Notes (Signed)
NAMEDELSON, [name] NO.:  [id number]  MEDICAL RECORD NO.:  [id number]  LOCATION:  1407                         FACILITY:  [facility]  PHYSICIAN:  [name] Barefoot, MD    DATE OF BIRTH:  06/30/1930                                PROGRESS NOTE   Discharge date to be determined.  CURRENT DIAGNOSES: 1. Hypercalcemia, unclear etiology.  This could be secondary to     familial hypercalcemia hypercalciuria, renal failure, or     hypercalcemia of malignancy. 2. Pyelonephritis. 3. Probably anemia of chronic disease. 4. Acute-on-chronic renal failure with a creatinine peak at 2.0, now     resolved. 5. Scattered pulmonary nodules, need to repeat her CT followup.  PROCEDURES PERFORMED: 1. CT abdomen and pelvis showed mildly increased infiltration into the     perirenal fat bilaterally.  Changes might represent infectious or     inflammatory process with some bilateral renal cysts.  This could     be confirmed with ultrasound if clinically indicated.  Bilateral     inguinal hernias.  The right side containing the small bowel     without proximal obstruction.  Degenerative changes and laminar     lesions in the spine. 2. Bone survey, no suspicious bony abnormality. 3. Renal ultrasound, bilateral renal cysts without significant change     from previous CT.  Normal sized kidneys.  No hydronephrosis.  No     acute process evident. 4. Bone whole body, degenerative changes as above, negative bony     metastatic disease. 5. CT abdomen and pelvis on August 29th, status post prostatectomy.     No evidence of metastatic disease in the abdomen and pelvis. 6. CT chest without contrast showed no evidence of metastasis in the     chest and showed lobular and paraseptal emphysema.  A scattered     tiny 3 to 4 mm pulmonary nodule as described above. 7. Chest x-ray on August 29th, hyperinflation consistent with COPD     without focal acute finding.  BRIEF HISTORY OF PRESENT  ILLNESS: This is a very pleasant 77 year old who presents to Dr. Molly Maduro Reade's office at [facility] with a chief complaint of fatigue.  The patient has not been feeling well for the last couple of weeks.  He said he has lost his appetite.  He also has lost 15 pounds.  He has had some nasal congestion and episodic cough, decreased oral intake.  He had prior history of iron deficiency anemia.  He had a colonoscopy and endoscopy in May, it was unremarkable per Dr. Su Ellis notes.  ASSESSMENT AND PLAN: 1. Hypercalcemia, unclear etiology.  This could be to familial     hypercalcemia hypercalciuria versus renal failure versus     secondary to hypercalcemia of undetermined malignancy.  The patient     had a workup for multiple myeloma, it has been negative.  Vitamin D     was normal at 44.  PTH was also normal.  PSA was low as 0.04.  TSH     was normal at 0.64.  PTH-like peptide is pending at this time.  He     has a urine calcium collection over  24 hours elevated at 360, but     this result might be effected by Lasix use.  The patient's calcium     level peak at 13.  Over course of hospitalization, has decreased to     9.3.  The patient was treated initially with IV fluids and Lasix.     He received three doses of calcitonin intramuscular.  He also     received pamidronate 1 dose IV.  At this time, he is getting IV     fluids, Lasix has been on hold.  The patient will need to follow up     with an endocrinologist to further determine etiology of     hypercalcemia.  He will need probably repeated dose of pamidronate     if his calcium increased. 2. Pyelonephritis.  The patient was complaining of dysuria.     Urinalysis was negative for white blood cells, but urine culture     grew Proteus mirabilis, sensitive to ceftriaxone and Cipro.  He was     complaining over course of hospitalization of abdominal pain.  For     this reason, a CT abdomen was repeated and it showed findings      consistent with bilateral renal fat with increased infiltration,     probably secondary to infection or inflammation.  The patient has     been getting treatment with ciprofloxacin for pyelonephritis.  His     white count has remained stable, but he had a mild temperature on     September 3rd. 3. Acute-on-chronic renal failure.  The patient presents with     worsening renal function.  Per prior record, his creatinine is 1.5.     He presented with a creatinine at 2.  After IV fluids, his renal     failure has resolved.  On September 4th, his creatinine was 1.1. 4. Anemia, unclear etiology.  This could be anemia of chronic disease     versus secondary to renal failure.  Hematology was consulted.  A     workup for multiple myeloma has been negative.  The patient might     benefit with a bone marrow biopsy.  We will defer this to     hematologist if this is going to be done inpatient or outpatient.     He had a prior endoscopy and colonoscopy, as per records negative.     He had a ferritin level at 982, iron at 27, total iron binding     capacity of 162.  Epo level was normal at 30. 5. Transaminases.  The patient presented with increased liver function     tests.  He had a hepatitis panel that was negative.  He will need     repeated function tests in a.m.  Repeated function tests on     September 1st were decreasing.  Transaminases could be secondary to     medications.  The patient currently on methotrexate.  Monitor very     closely liver function tests on methotrexate.  DISPOSITION: The patient might be able to go to a skilled nursing facility when his pyelonephritis improved and he will need a very close followup of his calcium level and he will need to follow up with hematologist.     [name] Barefoot, MD     BR/MEDQ  D:  08/25/2011  T:  08/26/2011  Job:  [id number]  Electronically Signed by [name] Barefoot MD on 08/29/2011 08:13:27 PM

## 2011-08-30 LAB — HEPATIC FUNCTION PANEL
ALT: 76 U/L — ABNORMAL HIGH (ref 0–53)
AST: 37 U/L (ref 0–37)
Albumin: 2.5 g/dL — ABNORMAL LOW (ref 3.5–5.2)
Bilirubin, Direct: 0.2 mg/dL (ref 0.0–0.3)
Total Bilirubin: 0.6 mg/dL (ref 0.3–1.2)

## 2011-08-30 LAB — CBC
MCH: 33 pg (ref 26.0–34.0)
MCHC: 33.7 g/dL (ref 30.0–36.0)
MCV: 97.9 fL (ref 78.0–100.0)
Platelets: 270 10*3/uL (ref 150–400)

## 2011-08-30 LAB — DIFFERENTIAL
Basophils Relative: 1 % (ref 0–1)
Eosinophils Absolute: 0.6 10*3/uL (ref 0.0–0.7)
Lymphs Abs: 0.7 10*3/uL (ref 0.7–4.0)
Monocytes Absolute: 0.6 10*3/uL (ref 0.1–1.0)
Neutrophils Relative %: 65 % (ref 43–77)

## 2011-08-30 LAB — URINE MICROSCOPIC-ADD ON

## 2011-08-30 LAB — URINALYSIS, ROUTINE W REFLEX MICROSCOPIC
Bilirubin Urine: NEGATIVE
Glucose, UA: NEGATIVE mg/dL
Specific Gravity, Urine: 1.015 (ref 1.005–1.030)
Urobilinogen, UA: 0.2 mg/dL (ref 0.0–1.0)

## 2011-08-31 ENCOUNTER — Inpatient Hospital Stay (HOSPITAL_COMMUNITY): Payer: Medicare Other

## 2011-08-31 LAB — DIFFERENTIAL
Basophils Absolute: 0 10*3/uL (ref 0.0–0.1)
Eosinophils Relative: 7 % — ABNORMAL HIGH (ref 0–5)
Lymphocytes Relative: 9 % — ABNORMAL LOW (ref 12–46)
Lymphs Abs: 0.5 10*3/uL — ABNORMAL LOW (ref 0.7–4.0)
Neutro Abs: 4 10*3/uL (ref 1.7–7.7)
Neutrophils Relative %: 67 % (ref 43–77)

## 2011-08-31 LAB — URINE CULTURE
Colony Count: 15000
Culture  Setup Time: 201209091125

## 2011-08-31 LAB — VITAMIN D 25 HYDROXY (VIT D DEFICIENCY, FRACTURES): Vit D, 25-Hydroxy: 39 ng/mL (ref 30–89)

## 2011-08-31 LAB — CBC
HCT: 26 % — ABNORMAL LOW (ref 39.0–52.0)
MCV: 97.7 fL (ref 78.0–100.0)
Platelets: 307 10*3/uL (ref 150–400)
RBC: 2.66 MIL/uL — ABNORMAL LOW (ref 4.22–5.81)
WBC: 5.9 10*3/uL (ref 4.0–10.5)

## 2011-08-31 LAB — BASIC METABOLIC PANEL
BUN: 16 mg/dL (ref 6–23)
CO2: 19 mEq/L (ref 19–32)
Chloride: 105 mEq/L (ref 96–112)
Creatinine, Ser: 1.11 mg/dL (ref 0.50–1.35)
Potassium: 3.7 mEq/L (ref 3.5–5.1)

## 2011-09-01 LAB — DIFFERENTIAL
Basophils Absolute: 0.1 10*3/uL (ref 0.0–0.1)
Basophils Relative: 1 % (ref 0–1)
Eosinophils Absolute: 0.3 10*3/uL (ref 0.0–0.7)
Eosinophils Relative: 5 % (ref 0–5)
Monocytes Absolute: 1.3 10*3/uL — ABNORMAL HIGH (ref 0.1–1.0)
Monocytes Relative: 24 % — ABNORMAL HIGH (ref 3–12)
Neutro Abs: 3.3 10*3/uL (ref 1.7–7.7)

## 2011-09-01 LAB — CBC
HCT: 26.1 % — ABNORMAL LOW (ref 39.0–52.0)
MCV: 95.6 fL (ref 78.0–100.0)

## 2011-09-01 LAB — BASIC METABOLIC PANEL
BUN: 17 mg/dL (ref 6–23)
CO2: 18 mEq/L — ABNORMAL LOW (ref 19–32)
Chloride: 106 mEq/L (ref 96–112)
Creatinine, Ser: 1.12 mg/dL (ref 0.50–1.35)

## 2011-09-02 LAB — VITAMIN D 1,25 DIHYDROXY
Vitamin D 1, 25 (OH)2 Total: 81 pg/mL — ABNORMAL HIGH (ref 18–72)
Vitamin D2 1, 25 (OH)2: <8 pg/mL
Vitamin D3 1, 25 (OH)2: 81 pg/mL

## 2011-09-02 LAB — DIFFERENTIAL
Basophils Absolute: 0.1 10*3/uL (ref 0.0–0.1)
Eosinophils Absolute: 0.4 10*3/uL (ref 0.0–0.7)
Lymphocytes Relative: 14 % (ref 12–46)
Neutro Abs: 3.4 10*3/uL (ref 1.7–7.7)
Neutrophils Relative %: 58 % (ref 43–77)

## 2011-09-02 LAB — BASIC METABOLIC PANEL
BUN: 18 mg/dL (ref 6–23)
Calcium: 8.6 mg/dL (ref 8.4–10.5)
Creatinine, Ser: 1.27 mg/dL (ref 0.50–1.35)
GFR calc Af Amer: >60 mL/min (ref >60)
GFR calc non Af Amer: 55 mL/min — ABNORMAL LOW (ref >60)
Potassium: 3.7 mEq/L (ref 3.5–5.1)

## 2011-09-02 LAB — LEGIONELLA ANTIGEN, URINE: Legionella Antigen, Urine: NEGATIVE

## 2011-09-02 LAB — URINE CULTURE: Colony Count: 30000

## 2011-09-02 LAB — CBC
HCT: 27.2 % — ABNORMAL LOW (ref 39.0–52.0)
MCH: 32.5 pg (ref 26.0–34.0)
MCHC: 33.8 g/dL (ref 30.0–36.0)
RDW: 16.3 % — ABNORMAL HIGH (ref 11.5–15.5)

## 2011-09-03 LAB — CBC
MCHC: 34.4 g/dL (ref 30.0–36.0)
Platelets: 433 10*3/uL — ABNORMAL HIGH (ref 150–400)
RDW: 16.5 % — ABNORMAL HIGH (ref 11.5–15.5)
WBC: 7.6 10*3/uL (ref 4.0–10.5)

## 2011-09-03 LAB — VANCOMYCIN, TROUGH: Vancomycin Tr: 18.9 ug/mL (ref 10.0–20.0)

## 2011-09-03 LAB — DIFFERENTIAL
Basophils Absolute: 0.2 10*3/uL — ABNORMAL HIGH (ref 0.0–0.1)
Eosinophils Absolute: 0.8 10*3/uL — ABNORMAL HIGH (ref 0.0–0.7)
Lymphocytes Relative: 28 % (ref 12–46)
Monocytes Absolute: 1.7 10*3/uL — ABNORMAL HIGH (ref 0.1–1.0)
Neutrophils Relative %: 37 % — ABNORMAL LOW (ref 43–77)

## 2011-09-03 LAB — BASIC METABOLIC PANEL
Chloride: 109 mEq/L (ref 96–112)
Creatinine, Ser: 1.27 mg/dL (ref 0.50–1.35)
GFR calc Af Amer: >60 mL/min (ref >60)
GFR calc non Af Amer: 55 mL/min — ABNORMAL LOW (ref >60)
Potassium: 3.6 mEq/L (ref 3.5–5.1)

## 2011-09-04 LAB — CBC
Hemoglobin: 8.4 g/dL — ABNORMAL LOW (ref 13.0–17.0)
MCHC: 34.4 g/dL (ref 30.0–36.0)
Platelets: 509 10*3/uL — ABNORMAL HIGH (ref 150–400)
RBC: 2.56 MIL/uL — ABNORMAL LOW (ref 4.22–5.81)

## 2011-09-05 LAB — CULTURE, BLOOD (ROUTINE X 2)
Culture  Setup Time: 201209091126
Culture  Setup Time: 201209091126
Culture: NO GROWTH

## 2011-09-05 LAB — PTH-RELATED PEPTIDE

## 2011-09-06 ENCOUNTER — Inpatient Hospital Stay (HOSPITAL_COMMUNITY)
Admission: EM | Admit: 2011-09-06 | Discharge: 2011-09-08 | DRG: 194 | Disposition: A | Discharge status: Skilled Nursing Facility | Payer: Medicare Other | Attending: Internal Medicine | Admitting: Internal Medicine

## 2011-09-06 ENCOUNTER — Emergency Department (HOSPITAL_COMMUNITY): Payer: Medicare Other

## 2011-09-06 DIAGNOSIS — I10 Essential (primary) hypertension: Secondary | ICD-10-CM | POA: Diagnosis present

## 2011-09-06 DIAGNOSIS — R5383 Other fatigue: Secondary | ICD-10-CM | POA: Diagnosis present

## 2011-09-06 DIAGNOSIS — N179 Acute kidney failure, unspecified: Secondary | ICD-10-CM | POA: Diagnosis present

## 2011-09-06 DIAGNOSIS — D473 Essential (hemorrhagic) thrombocythemia: Secondary | ICD-10-CM | POA: Diagnosis present

## 2011-09-06 DIAGNOSIS — R627 Adult failure to thrive: Secondary | ICD-10-CM | POA: Diagnosis present

## 2011-09-06 DIAGNOSIS — R7402 Elevation of levels of lactic acid dehydrogenase (LDH): Secondary | ICD-10-CM | POA: Diagnosis present

## 2011-09-06 DIAGNOSIS — E876 Hypokalemia: Secondary | ICD-10-CM | POA: Diagnosis present

## 2011-09-06 DIAGNOSIS — M069 Rheumatoid arthritis, unspecified: Secondary | ICD-10-CM | POA: Diagnosis present

## 2011-09-06 DIAGNOSIS — I5022 Chronic systolic (congestive) heart failure: Secondary | ICD-10-CM | POA: Diagnosis present

## 2011-09-06 DIAGNOSIS — I509 Heart failure, unspecified: Secondary | ICD-10-CM | POA: Diagnosis present

## 2011-09-06 DIAGNOSIS — D638 Anemia in other chronic diseases classified elsewhere: Secondary | ICD-10-CM | POA: Diagnosis present

## 2011-09-06 DIAGNOSIS — R5381 Other malaise: Secondary | ICD-10-CM | POA: Diagnosis present

## 2011-09-06 DIAGNOSIS — E785 Hyperlipidemia, unspecified: Secondary | ICD-10-CM | POA: Diagnosis present

## 2011-09-06 DIAGNOSIS — R7401 Elevation of levels of liver transaminase levels: Secondary | ICD-10-CM | POA: Diagnosis present

## 2011-09-06 DIAGNOSIS — J189 Pneumonia, unspecified organism: Principal | ICD-10-CM | POA: Diagnosis present

## 2011-09-06 LAB — URINALYSIS, ROUTINE W REFLEX MICROSCOPIC
Bilirubin Urine: NEGATIVE
Glucose, UA: NEGATIVE mg/dL
Ketones, ur: NEGATIVE mg/dL
Leukocytes, UA: NEGATIVE
Nitrite: NEGATIVE
Protein, ur: 30 mg/dL — AB
Specific Gravity, Urine: 1.014 (ref 1.005–1.030)
Urobilinogen, UA: 0.2 mg/dL (ref 0.0–1.0)
pH: 5.5 (ref 5.0–8.0)

## 2011-09-06 LAB — CBC
HCT: 26.4 % — ABNORMAL LOW (ref 39.0–52.0)
Hemoglobin: 8.9 g/dL — ABNORMAL LOW (ref 13.0–17.0)
MCH: 32 pg (ref 26.0–34.0)
MCHC: 33.7 g/dL (ref 30.0–36.0)
MCV: 95 fL (ref 78.0–100.0)
Platelets: 710 10*3/uL — ABNORMAL HIGH (ref 150–400)
RBC: 2.78 MIL/uL — ABNORMAL LOW (ref 4.22–5.81)
RDW: 17 % — ABNORMAL HIGH (ref 11.5–15.5)
WBC: 13.5 10*3/uL — ABNORMAL HIGH (ref 4.0–10.5)

## 2011-09-06 LAB — DIFFERENTIAL
Basophils Absolute: 0.1 10*3/uL (ref 0.0–0.1)
Basophils Relative: 1 % (ref 0–1)
Eosinophils Absolute: 0.4 10*3/uL (ref 0.0–0.7)
Eosinophils Relative: 3 % (ref 0–5)
Lymphocytes Relative: 30 % (ref 12–46)
Lymphs Abs: 4.1 10*3/uL — ABNORMAL HIGH (ref 0.7–4.0)
Monocytes Absolute: 1.9 10*3/uL — ABNORMAL HIGH (ref 0.1–1.0)
Monocytes Relative: 14 % — ABNORMAL HIGH (ref 3–12)
Neutro Abs: 7 10*3/uL (ref 1.7–7.7)
Neutrophils Relative %: 52 % (ref 43–77)

## 2011-09-06 LAB — URINE MICROSCOPIC-ADD ON

## 2011-09-06 LAB — BASIC METABOLIC PANEL
BUN: 21 mg/dL (ref 6–23)
CO2: 24 mEq/L (ref 19–32)
Calcium: 10 mg/dL (ref 8.4–10.5)
Chloride: 109 mEq/L (ref 96–112)
Creatinine, Ser: 1.38 mg/dL — ABNORMAL HIGH (ref 0.50–1.35)
GFR calc Af Amer: >60 mL/min (ref >60)
GFR calc non Af Amer: 50 mL/min — ABNORMAL LOW (ref >60)
Glucose, Bld: 87 mg/dL (ref 70–99)
Potassium: 3.4 mEq/L — ABNORMAL LOW (ref 3.5–5.1)
Sodium: 141 mEq/L (ref 135–145)

## 2011-09-06 LAB — POCT I-STAT TROPONIN I: Troponin i, poc: 0.04 ng/mL (ref 0.00–0.08)

## 2011-09-06 LAB — CK TOTAL AND CKMB (NOT AT ARMC)
CK, MB: 2.3 ng/mL (ref 0.3–4.0)
Total CK: 69 U/L (ref 7–232)

## 2011-09-07 LAB — CARDIAC PANEL(CRET KIN+CKTOT+MB+TROPI)
Relative Index: INVALID (ref 0.0–2.5)
Troponin I: <0.3 ng/mL (ref <0.30)

## 2011-09-08 LAB — BASIC METABOLIC PANEL
CO2: 27 mEq/L (ref 19–32)
Chloride: 106 mEq/L (ref 96–112)
GFR calc non Af Amer: 39 mL/min — ABNORMAL LOW (ref >60)
Glucose, Bld: 78 mg/dL (ref 70–99)
Potassium: 3.2 mEq/L — ABNORMAL LOW (ref 3.5–5.1)
Sodium: 142 mEq/L (ref 135–145)

## 2011-09-08 LAB — CBC
HCT: 24.9 % — ABNORMAL LOW (ref 39.0–52.0)
Hemoglobin: 8.4 g/dL — ABNORMAL LOW (ref 13.0–17.0)
MCHC: 33.7 g/dL (ref 30.0–36.0)
RBC: 2.6 MIL/uL — ABNORMAL LOW (ref 4.22–5.81)
WBC: 14.5 10*3/uL — ABNORMAL HIGH (ref 4.0–10.5)

## 2011-09-09 NOTE — Consult Note (Signed)
[name], [name] NO.:  [id number]  MEDICAL RECORD NO.:  [id number]  LOCATION:  1407                         FACILITY:  [facility]  PHYSICIAN:  [name], MDDATE OF BIRTH:  [birth year]  DATE OF CONSULTATION: DATE OF DISCHARGE:                                CONSULTATION   REQUESTING PHYSICIAN:  [name] Scott, MD  INDICATIONS:  Elevated liver function tests, anemia.  HISTORY OF PRESENT ILLNESS:  Mr. [name] is a pleasant 77 year old white male with a history of prostate cancer who is status post surgery and radiation for that in [place], [place].  He reportedly moved to [place] from [place] back in the spring.  He does have a history of iron-deficiency anemia and was hospitalized for that in April.  He reportedly had an unrevealing colonoscopy and endoscopy as an outpatient by Dr. Madilyn Ellis in April.  He has been on methotrexate for rheumatoid arthritis for over 1 year.  He also has been on Lovaza in addition to his other medicines.  His liver function tests were normal in April.  On admission for weakness on August 18, 2011, his ALP was 223, AST 99, ALT 123 with a normal total bilirubin of 0.8.  His liver enzymes trended down until today on August 27, 2011, when his ALP was 348, AST 69, ALT 133 with a normal T-bili of 0.83.  He had a noncontrast CT scan on August 19, 2011, which showed possible layering sludge in his gallbladder without extra intrahepatic dilation.  A repeat noncontrast CT on September 3 reports a normal-appearing gallbladder.  He denies any abdominal pain, nausea, vomiting.  PAST MEDICAL HISTORY: 1. History of prostate cancer. 2. Status post radical prostatectomy and radiation. 3. Congestive heart failure. 4. Atrial fibrillation. 5. Rheumatoid arthritis on methotrexate. 6. Allergic rhinitis. 7. Hypertension. 8. Hyperlipidemia. 9. History of tobacco use. 10.History of anemia as stated above (history of  iron-deficiency     anemia). 11.Status post bilateral knee replacement. 12.Status post cataract surgery.  CURRENT MEDICINES:  Methotrexate, Cipro, Lovenox, ferrous sulfate, Remeron, Protonix, artificial tears, dose listed in hospital record. Additional medicines listed p.r.n. and listed in hospital record.  ALLERGIES:  No known drug allergies.  FAMILY HISTORY:  Noncontributory.  SOCIAL HISTORY:  Recently married, former smoker, denies alcohol.  REVIEW OF SYSTEMS:  Negative from GI standpoint except stated above.  PHYSICAL EXAM:  VITAL SIGNS:  T-max 99.5, pulse 70, blood pressure 119/63. GENERAL:  Elderly, frail, sleeping but easily arousable, no acute distress. ABDOMEN:  Soft, nontender, nondistended.  Active bowel sounds.  LABS:  White blood count 4.0, hemoglobin 8.3, platelet count 287, T bili 0.8, ALP 348, AST 69, ALT 133, total protein 5.4, albumin 2.4, lipase 33.  Iron level 27, percent iron saturation 17, ferritin 982.  ASSESSMENT:  77 year old white male with elevated liver enzymes in the setting of long-term methotrexate and also on Lovaza which is metabolized by the liver.  His elevated liver enzymes are likely medicine related, mainly from the methotrexate and agree with holding that, but he also may have sludge from his gallbladder contributing to his elevated liver function test.  I need a better image of the gallbladder by doing a right  upper quadrant ultrasound.  We will also check GGT and alpha-fetoprotein level.  His anemia is likely chronic.  There is no evidence of overt bleeding. His colonoscopy and upper endoscopy earlier this year were unremarkable. I suspect a hematologic source.  Recommend supportive care for that and continue iron supplementation.     [name] Friar, MD     VCS/MEDQ  D:  08/27/2011  T:  08/28/2011  Job:  [id number]  cc:   [name], M.D. Fax: [phone]  [name]. [name], M.D. Fax: [phone]  Electronically Signed by  [name] Rakes MD on 09/09/2011 11:08:04 AM

## 2011-09-11 ENCOUNTER — Encounter: Payer: Medicare Other | Admitting: Oncology

## 2011-09-11 LAB — PHENOL LEVEL, URINE: Creatinine, Urine-PHENUR: 275 mg/L

## 2011-09-15 NOTE — H&P (Signed)
NAMEARRIN, PINTOR NO.:  [id number]  MEDICAL RECORD NO.:  [id number]  LOCATION:  1306                         FACILITY:  [facility]  PHYSICIAN:  Gordy Savers, MDDATE OF BIRTH:  June 24, 1930  DATE OF ADMISSION:  09/06/2011 DATE OF DISCHARGE:                             HISTORY & PHYSICAL   CHIEF COMPLAINT:  Progressive weakness.  HISTORY OF PRESENT ILLNESS:  The patient is a 77 year old gentleman with multiple medical problems.  He was hospitalized here from August 28th and discharged yesterday, September 15th.  Discharge diagnoses included hypercalcemia, pyelonephritis, anemia of chronic disease, and acute-on- chronic kidney disease.  He has a history of rheumatoid arthritis and was diagnosed and treated for bilateral hilar pneumonia.  He was to complete four additional days of Avelox therapy.  Since his arrival yesterday, he has been quite weak.  Today, he developed more profound weakness to the point where he was not able to ambulate or to rise from a sitting position off the commode.  The patient was brought in by his spouse who stated that she was unable to safely care for the patient due to his profound weakness.  The original plan was to discharge the patient to a rehab facility with wife felt that she could handle his acute care needs at home temporarily.  There apparently is a rehab bed available at [facility].  ED evaluation revealed stable vital signs, he was afebrile.  Chest x-ray revealed mild cardiomegaly and diffuse interstitial prominence.  There is some concern about early pulmonary edema.  White count was mildly elevated at 13.5.  He has stable anemia with a hemoglobin of 8.9, hematocrit 26.4.  Calcium was 10.0.  BUN and creatinine were stable at 21 and 1.38 respectively.  Urinalysis normal.  Troponin level initially was normal.  The patient is now admitted with failure to thrive and profound weakness.  PAST MEDICAL HISTORY:   Medical problems include recent treatment for hypercalcemia.  He was hospitalized recently for pyelonephritis and bilateral hilar pneumonia.  Medical problems include anemia of chronic disease, history of acute-on-chronic kidney failure, and rheumatoid arthritis.  Methotrexate therapy has been held.  He has a history of elevated transaminases.  Chronic medical problems include hypertension, dyslipidemia, and a history of congestive heart failure and atrial fibrillation.  He also has seasonal allergic rhinitis and history of prostate cancer.  Surgical procedures have included prior knee surgery; cataract extraction surgery; prostate surgery; and while in high school, facial surgery due to injury.  SOCIAL HISTORY:  The patient is recently married.  He has remote history of tobacco use in the 90s.  PRESENT MEDICAL REGIMEN: 1. Avelox 400 mg daily. 2. Aranesp 25 mcg subcu weekly. 3. Omeprazole 40 mg daily. 4. Tylenol p.r.n. 5. Atorvastatin 20 mg daily. 6. Iron sulfate 325 mg daily. 7. Lasix 40 mg daily. 8. Losartan 50 mg daily. 9. Mirtazapine 15 mg at bedtime.  REVIEW OF SYSTEMS:  Otherwise noncontributory.  He also complained of some mild exertional shortness of breath, which he states is fairly chronic.  He also describes some very minor left anterior chest pain that did not sound particularly ischemic.  PHYSICAL EXAMINATION:  GENERAL:  An elderly male  who is weak, alert at times appeared to be mildly confused.  It took him bed to recall that he had been discharged to home from the hospital. VITAL SIGNS:  Temperature 97.7, blood pressure 115/60, pulse rate 65, respiratory rate 16, O2 saturation 99%. SKIN:  Warm and dry without rash. HEENT:  Normal pupil responses.  Conjunctivae clear.  Arcus senilis was noted.  Ear, nose, and throat, unremarkable.  The patient was edentulous with dentures in place.  Oropharynx appeared to be reasonably well hydrated. NECK:  No bruits,  adenopathy, or neck vein distention. CHEST:  A few crackles left base only. CARDIOVASCULAR:  Normal S1 and S2.  A great 2/6 systolic murmur was heard diffusely, loudest at the primary aortic area. ABDOMEN:  Soft and nontender.  No organomegaly or masses.  No liver enlargement.  There is a midline scar in the epigastric region. Bilateral femoral bruits were noted.  External genitalia normal. EXTREMITIES:  Trace edema.  The right posterior tibial pulse was palpable.  Other pedal pulses were not easily palpable.  Feet were well perfused. NEUROLOGIC:  The patient to be alert, but slightly confused.  He was able to state that he was at [facility].  Cranial nerve exam nonfocal.  Motor exam was unremarkable except for generalized weakness.  There is no focal deficit.  Sensory exam intact to soft touch.  Reflexes unremarkable.  No pathological reflexes demonstrated.  IMPRESSION: 1. Profound weakness and failure to thrive.  We will obtain a physical     therapy consult.  The patient will be discharged to rehab facility     when available. 2. Recent pneumonia.  We will complete 4 additional days of Avelox     therapy. 3. History of congestive heart failure and chest x-ray consistent with     increased interstitial markings.  The patient has received his     usual morning dose of furosemide and an additional dose in the ED.     He clinically appears to be euvolemic.  We will check BNP and     reassess his volume status in the morning.  ADDITIONAL DIAGNOSES: 1. History of hypercalcemia. 2. Recent pyelonephritis, 3. Rheumatoid arthritis. 4. Hypertension. 5. Well-controlled dyslipidemia 6. History of paroxysmal atrial fibrillation. 7. Remote prostate cancer.     Gordy Savers, MD     PFK/MEDQ  D:  09/06/2011  T:  09/07/2011  Job:  [id number]  Electronically Signed by Eleonore Chiquito MD on 09/15/2011 12:47:11 PM

## 2011-09-21 NOTE — Discharge Summary (Signed)
NAMEANTONIA, Ellis NO.:  [id number]  MEDICAL RECORD NO.:  [id number]  LOCATION:  1407                         FACILITY:  [facility]  PHYSICIAN:  Thad Ranger, MD       DATE OF BIRTH:  30-Oct-1930  DATE OF ADMISSION:  08/18/2011 DATE OF DISCHARGE:                        DISCHARGE SUMMARY - REFERRING   PRIMARY CARE PHYSICIAN:  Robert A. Nicholos Johns, MD  DISCHARGE DIAGNOSES: 1. Hypercalcemia, improved. 2. Pyelonephritis, completed the course of antibiotics. 3. Anemia of chronic disease, on weekly Aranesp. 4. Acute on chronic renal failure with creatinine peak at 2.0, now     resolved. 5. Transaminitis. 6. History of rheumatoid arthritis.  Methotrexate on hold secondary to     transaminitis. 7. Penile traumatic ulcer secondary to trauma, improved. 8. Bilateral perihilar pneumonia.  CONSULTATIONS: 1. Hematology/Oncology, Dr. Welton Flakes. 2. Gastroenterology, Dr. Bosie Clos. 3. Endocrinology, Dr. Reather Littler.  DISCHARGE MEDICATIONS: 1. Avelox 400 mg p.o. daily for another 5 days. 2. Aranesp 25 mcg subcu weekly at the [facility]. 3. Omeprazole 40 mg p.o. daily. 4. Arthritis relief tablet OTC every 6 hours as needed. 5. Atorvastatin 20 mg p.o. daily. 6. Diphenhydramine 25 mg p.o. b.i.d. as needed for allergies. 7. Ferrous sulfate 325 mg. 8. Fish oil 1000 mg p.o. daily. 9. Furosemide 40 mg daily. 10.Losartan 50 mg daily. 11.Mirtazapine 15 mg daily at bedtime.   The patient was counseled to stop taking the following medications which include; 1. Methotrexate, placed on hold. 2. Solu-Cortef. 3. Pseudoephedrine.  BRIEF HISTORY OF PRESENT ILLNESS:  At the time of admission, [name] is a 77 year old man who presented to Dr. Molly Maduro Reade's office at [facility] with a chief complaint of fatigue.  He was not feeling well for last few weeks prior to admission and had lost appetite and lost 15 pounds.  He had some nasal congestion and episodic cough with  decreased oral intake.  The patient had a history of iron-deficiency anemia; had colonoscopy and endoscopy in May, which was unremarkable per Dr. Molly Maduro Reade's notes.  PROCEDURES AND IMAGING PERFORMED DURING THE HOSPITALIZATION: 1. Chest x-ray, 2-view, August 29, hyperinflation consistent with COPD     without focal acute findings. 2. CT chest without contrast showed no evidence of metastatic disease     in the chest,centrilobular and paraseptal emphysema, scattered tiny     3 to 4-mm pulmonary nodule, ectasia of the ascending thoracic aorta     up to 0.8 cm. 3. CT abdomen showed status post prostatectomy.  No evidence of     metastatic disease in abdominal and pelvis. 4. Nuclear medicine whole body scan showed degenerative changes with     negative bony metastatic disease. 5. Renal ultrasound on August 30th showed bilateral renal cysts     without significant change from the previous CT.  Normal-sized     kidneys.  No hydronephrosis.  No acute process. 6. Bone survey map  on August 31stys showed no suspicious bony     abnormalities. 7. CT abdomen and pelvis without contrast on September 3rd showed     mildly increasing infiltration into the pararenal fat bilaterally,     changes might represent infectious or inflammatory process;  presumed bilateral renal cyst, this could be confirmed with     ultrasound. 8. Right iliac bone marrow aspiration and core biopsy were done on     September 7th, which showed hypercellular bone marrow for age with     dyspoietic changes. 9. Ultrasound of abdomen on September 8th showed bilateral renal cyst,     right pelvocaliectasis, echogenic structure within the liver     parenchyma, likely benign hemangioma. 10.Chest x-ray, 2-view, September 10th, mild cardiomegaly without     pulmonary edema, new bilateral perihilar infiltrates, no evidence     of pneumothorax or fracture.  BRIEF HOSPITALIZATION COURSE:  The patient was admitted  with hypercalcemia with calcium of 13.8 at the time of admission.  The patient had marked hypercalcemia, questionable for malignancy versus primary hyperparathyroidism.  The patient had nuclear medicine whole body scan with bone survey maps which were negative for malignancy.  The 25-hydroxy vitamin D level was normal, however, 1, 25-hydroxy total was elevated at 81.  The patient does not appear to have obvious malignancy, but unexplained severe anemia and elevated liver function.  PTH-related protein was also in the upper normal side.  The patient did not have any lesions on bone survey.  He also had bone marrow biopsy done, which was potentially negative for any malignancy.  The patient was started on pamidronate and also calcitonin per the Endocrinology recommendations by Dr. Reather Littler.  He was also placed on hydration.  He will follow up with Dr. Reather Littler as an outpatient within next 7 to 10 days.  At the time of the discharge, calcium is improved to 8.7. 1. Pyelonephritis.  The patient did complain of dysuria during the     hospitalization.  Urine culture showed Proteus mirabilis sensitive     to ceftriaxone and Cipro.  The patient complained of abdominal pain     which prompted the CT of the abdomen and pelvis, and showed     bilateral renal fat with increased infiltration secondary to     infection or inflammation.  The patient has completed the course of     ciprofloxacin and IV antibiotics.  Renal ultrasound was obtained on     September 8th, which was essentially normal, but bilateral renal     cysts and right-sided pelvocaliectasis. 2. Bilateral perihilar pneumonia.  The patient was started on IV     vancomycin and Zosyn, likely secondary to hospital-acquired     pneumonia.  The patient has received 5 days of IV antibiotics and     hence we will place him on Avelox for another 5 days to complete a     course for 10 days. 3. Anemia.  The patient was also closely followed by  the Hematology     Service.  The patient had bone marrow biopsy and aspiration done     which was negative for any blasts formation.  No     increase in blastic cells were identified.  Per the surgical     pathology report, features were worrisome for myelodysplastic state     which cannot be excluded.  The patient will continue Aranesp weekly     at the cancer center and will follow up with Dr. Drue Second from     Oncology Service within next 7 to 10 days for further workup. 4. Transaminitis, possibly secondary to methotrexate which has been     placed on hold. 5. Generalized debility.  The patient did have a physical therapy  evaluation during the hospitalization and recommended skilled     nursing facility.  However, the patient is not agreeable and wants     to be discharged home.  Home PT/OT, home RN, and home O2 will be     set up prior to the discharge.  A 24x7 supervision is also     recommended.  FOLLOWUP:  Follow up with Dr. Elias Else within next 1 week.  Follow up with Dr. Drue Second and Dr. Reather Littler in Endocrinology within next 7 to 10 days.  PHYSICAL EXAMINATION:  VITAL SIGNS:  At the time of the discharge, blood pressure 114/65, temperature 98.2, pulse 82, respirations 18, O2 sats 92% on 3 L. GENERAL:  The patient alert and awake, not in acute distress. HEENT:  Pupils equally reacting to light and accommodation. EOMI. NECK:  Supple.  No lymphopathy. CVS:  S1, S2 clear. CHEST:  Clear to auscultation bilaterally. ABDOMEN:  Soft, nontender, nondistended.  Normal bowel sounds. EXTREMITIES:  No cyanosis, clubbing, or edema noted in the upper or lower extremities bilaterally.  DISCHARGE TIME:  45 minutes.     Thad Ranger, MD     RR/MEDQ  D:  09/05/2011  T:  09/05/2011  Job:  [id number]  cc:   Molly Maduro A. Nicholos Johns, M.D. Fax: [phone]  Drue Second, M.D.  Reather Littler, M.D. Fax: [phone]  Electronically Signed by Mazell Aylesworth  on 09/21/2011 03:02:30 PM

## 2011-09-28 NOTE — H&P (Signed)
NAMEHOLTEN, SPANO NO.:  [id number]  MEDICAL RECORD NO.:  [id number]  LOCATION:  1223                         FACILITY:  [facility]  PHYSICIAN:  Richarda Overlie, MD       DATE OF BIRTH:  01/24/1930  DATE OF ADMISSION:  08/18/2011 DATE OF DISCHARGE:                             HISTORY & PHYSICAL   PRIMARY CARE PHYSICIAN:  Robert A. Nicholos Johns, M.D.  CHIEF COMPLAINT:  Weakness.  SUBJECTIVE:  This is a 77 year old male who presented to Dr. Molly Maduro Reade's office at [facility] with a chief complaint of fatigue. The patient has not been feeling well for the last couple of weeks.  He states that he has lost his appetite.  He has also lost about 15 pounds, but has gained back some.  He has also had some nasal congestion and episodic cough.  He has had episodic dyspnea on exertion, but no dyspnea at rest.  The patient has also been noted to have decreased consumption of both liquids and solids.  He has also noted some black tarry stools that he has been taking iron supplements for the last 2 months.  He states that he was diagnosed with iron-deficiency anemia.  He underwent colonoscopy and endoscopy in May, which were apparently unremarkable per Dr. Molly Maduro Reade's notes.  The patient also has a history of prostate cancer and was evaluated by Dr. [facility] from [place], [place] prior to April of this year as the patient used to live in [place], [place].  Apparently, the patient has had radiation treatment for his prostate cancer.  He occasionally experiences urinary incontinence, but has not had any urinary urgency, frequency, or dysuria.  PAST MEDICAL HISTORY: 1. Hypertension. 2. Dyslipidemia. 3. CHF. 4. Atrial fibrillation. 5. Rheumatoid arthritis. 6. Allergic rhinitis. 7. History of prostate cancer.  HOME MEDICATIONS:  Diphenhydramine, Prilosec, atorvastatin, Lasix, Remeron, methotrexate, fish oil, Arthritis, ferrous sulfate,  Prostavit, Solu-Cortef.  Sudafed.  SOCIAL HISTORY:  The patient denies any history of smoking, but was a former smoker, quit in the 1990s.  No current use of alcohol.  He is currently married.  ALLERGIES:  No known drug allergies.  REVIEW OF SYSTEMS:  A 14-point review of systems was done as documented in HPI.  PHYSICAL EXAMINATION:  VITAL SIGNS: Blood pressure 101/72, pulse of 63, respirations 18. GENERAL:  Currently comfortable in no acute cardiopulmonary distress. HEENT:  Pupils equal and reactive.  Extraocular movements intact. NECK:  Supple.  No JVD. LUNGS: Clear to auscultation bilaterally.  No wheezes, crackles, or rhonchi. CARDIOVASCULAR: Regular rate and rhythm.  No murmurs, rubs, or gallops. ABDOMEN: Obese, soft, nontender, nondistended. EXTREMITIES:  Without cyanosis, clubbing, or edema. NEUROLOGIC:  Cranial nerves II-XII grossly intact. PSYCHIATRIC:  Appropriate mood and affect.  LABORATORY DATA:  Labs from Dr. Benjaman Pott office as of yesterday, hemoglobin 9.9, hematocrit 30.3, MCV 106.2, platelet count of 289, BUN39, creatinine 1.84, calcium 12.6, T bili 1.2, alk phos 200, AST 111, ALT 117.  TSH is 0.78.  Hemocultures negative.  EKG shows sinus rhythm with premature ventricular contractions at a rate of 76 beats per minute.  ASSESSMENT AND PLAN: 1. Hypercalcemia possibly secondary to hypercalcemia of malignancy. 2.  Macrocytic anemia with a negative esophagogastroduodenoscopy and     colonoscopy earlier in May this year. 3. Acute-on-chronic renal insufficiency with unknown baseline     creatinine, rule out multiple myeloma, rule out obstructive     uropathy, could also possibly be secondary to ATN in the setting of     decreased appetite and decreased p.o. intake. 4. Subacute productive cough for the last 2 weeks, rule out pneumonia,     rule out underlying metastatic disease or mass.  PLAN:  The patient will be admitted to the step-down unit because of  his multiple medical problems overnight and then probably be transferred to telemetry floor in the morning.  The patient will be hydrated aggressively with IV fluids.  We will treat his hypercalcemia with IV normal saline as well as IV Lasix.  We will also check a TSH, intact PTH, 25-OH vitamin D level.  Because of his history of prostate cancer, we will check a PSA and if the PSA is elevated then the patient would need a bone scan to rule out metastatic disease.  We will also check an ESR, SPEP, UPEP  to rule out multiple myeloma in the setting of his renal insufficiency and anemia.  If the patient's anemia cannot be explained by further workup then he would need a hematology consultation and possibly a bone marrow biopsy.  Because of his intermittent nausea, which could be explained by his elevated calcium as well as decreased appetite and weight loss, we will do a CT scan of the chest, abdomen and pelvis to rule out underlying malignancy.  We will also check his stool guaiacs.  The patient also has an elevated liver function panel.  We will check hepatitis panel.  We will hold off on the right upper quadrant ultrasound since we are doing a CT scan of the abdomen and pelvis.  If this is normal then the next step is to do a right upper quadrant ultrasound.  The patient otherwise is hemodynamically stable. He is a full code.     Richarda Overlie, MD     NA/MEDQ  D:  08/18/2011  T:  08/18/2011  Job:  [id number]  Electronically Signed by Richarda Overlie MD on 09/28/2011 02:16:39 PM

## 2011-09-29 NOTE — Consult Note (Signed)
[name], [name]             ACCOUNT NO.:  [id number]  MEDICAL RECORD NO.:  [id number]  LOCATION:  1407                           FACILITY:  PHYSICIAN:  Reather Littler, M.D.       DATE OF BIRTH:  Mar 12, 1930  DATE OF CONSULTATION: DATE OF DISCHARGE:                                CONSULTATION   REASON FOR CONSULTATION:  Hypercalcemia.  HISTORY:  This is a 77 year old Caucasian male, who is admitted for symptoms of weakness and weight loss.  Patient is a very poor historian and does not give any accurate history.  He probably started having decreased appetite and increasing weakness for about a month prior to admission.  The patient also states that he had a prior admission a year or two ago at [facility], where he was told that he had a high calcium, where also he was acutely ill.  He saw Dr. Nicholos Johns for routine evaluation on August 27 and his calcium was 12.6, do not see any other reports of serum chemistry in the patient's record except for in January his calcium was 9.0 and a second report of 9.4 in January also.  Patient was found on admission to have a calcium of over 13.  The patient was also appeared to be somewhat dehydrated and had a creatinine of about 2.  He was hydrated with IV fluids and about 2 days after admission, he was given 60 mg of pamidronate IV, with this his calcium had improved, but today his calcium is back up to 10.6.  He also has a known low albumin level.  PAST HISTORY:  The patient has a history of rheumatoid arthritis, atrial fibrillation, prostate cancer, congestive heart failure, and hypertension.  HOME MEDICATIONS:  Prilosec, Lipitor, Lasix, methotrexate, Remeron, and Benadryl as well as Prilosec and iron.  SOCIAL HISTORY:  The patient says he quit smoking in 1990s, does not use alcohol.  PERSONAL HISTORY:  He says he got married in April this year.  ALLERGIES:  None.  REVIEW OF SYSTEMS:  He has had no diabetes.  He has had  problems with anemia for quite some time and at some point had required transfusion. Etiology of this has not been clear.  In fact, he is just returning today from a bone marrow aspiration done as requested by hematologist. He says he has been on methotrexate for several years for his rheumatoid arthritis and it is stable.  He also has had some allergic rhinitis. There is some history of hypertension, but he is not on any current medications for this.  Does not complain of any chest pain and no shortness of breath.  He had some abdominal discomfort few days ago, but none now, has not had any swelling of his legs recently.  Does not complain of any nausea or vomiting or diarrhea.  His appetite has been decreased.  Patient did have a normal TSH and cortisol, the latter being 6.6 in the morning.  The patient does have chronic kidney disease with creatinine of about 1.8 in January and higher levels now.  Patient also did complain of some cough for a couple of weeks.  PHYSICAL EXAMINATION:  GENERAL:  The patient  is alert, but not answering all questions appropriately.  He is not in acute distress.  He is lying comfortably in bed. VITAL SIGNS:  His blood pressure is 115/70, pulse is 76 and regular, temperature 99 degrees. HEENT:  His mucous membranes look somewhat dry.  Eyes are externally normal. NECK EXAM:  Shows no lymphadenopathy or thyroid enlargement. HEART:  Sounds are normal. LUNGS:  Clear. ABDOMEN:  Some scars present.  No hepatosplenomegaly or other mass, has a little stool palpable in the left lower quadrant. EXTREMITIES:  Show normal. SKIN:  No edema or any other lesions. NEUROLOGIC:  No obvious motor weakness.  LABORATORY DATA:  The patient had a PTH level drawn on August 28, which was 30.5 with a calcium of 12.6.  Also PTH related protein was high normal at 27 with a normal up to 27, done on August 29th.  On that day his creatinine level was 1.63.  His  immunoelectrophoresis has been normal.  His chemistry shows markedly increased alkaline phosphatase of 400 and ALT and AST also increased to at least 2 to 3 times normal.  His most recent calcium is 10.3 with a creatinine of 1.16.  ASSESSMENT:  The patient has marked hypercalcemia and the question is whether this is malignancy or primary hyperparathyroidism.  The PTH level is not suppressed, so the possibility of primary hyperparathyroidism exists, but not definite.  He does have a 25-hydroxy vitamin D level, which is normal, but 1,25 level is not done. Currently, he has no obvious malignancy, but he has unexplained severe anemia and elevated liver functions, although his PTH related protein is upper normal.  This is in the face of some renal dysfunction and will need to be repeated.  The patient does not have any lesions on his bone survey. Currently, does not have a contrast enhanced CT scan of his abdomen and his ultrasound of his kidneys showed only cystic lesions.  Plan would be to start calcitonin since he has already received pamidronate about a week ago and also to continue the hydration started today.  He will also need repeat PTH, 1,25-hydroxy vitamin D, and PTH related protein.  We will continue to follow this patient and await results of other workup.     Reather Littler, M.D.     AK/MEDQ  D:  08/28/2011  T:  08/29/2011  Job:  [id number]  cc:   Molly Maduro A. Nicholos Johns, M.D. Fax: [phone]  Marcellus Scott, MD  Electronically Signed by Reather Littler M.D. on 09/29/2011 11:22:34 AM

## 2011-10-05 NOTE — Consult Note (Signed)
[name], [name] NO.:  [id number]  MEDICAL RECORD NO.:  [id number]  LOCATION:  1407                         FACILITY:  [facility]  PHYSICIAN:  [name], M.D     DATE OF BIRTH:  11-10-1930  DATE OF CONSULTATION:  08/20/2011 DATE OF DISCHARGE:                                CONSULTATION   REQUESTING PHYSICIAN:  [facility].  REASON FOR CONSULTATION:  Anemia.  Rule out multiple myeloma.  HISTORY OF PRESENT ILLNESS:  [name] is a 77 year old white male with a prior history of prostate cancer, status post radical prostatectomy and radiation in [place], [place], (no records available at this time), as well as a history of microcytic anemia and iron- deficiency anemia, who was evaluated on April 02, 2011, with endoscopy negative for malignancy.  He was admitted on August 18, 2011 with increasing fatigue, poor p.o. intake, and a 15-pound weight loss, as well as episodic dyspnea on exertion.  Labs on admission showed an H and H of 9.9 and 30.3 respectively, with an MCV elevated at 106, normal platelet count of 289.  It was noted that the patient had renal insufficiency as well, with a creatinine of 1.8 and a calcium of 13.8. His Hemoccult was negative.  The patient's H and H remained in the range from 8 to 9 and 24 to 26 respectively, but today his hemoglobin is 8.1 with a hematocrit of 23.7.  His white count and platelets continued to show normal counts.  His smear is unremarkable in morphology.  His hypercalcemia was treated with IV fluids and IV Lasix, and today he is to receive calcitonin.  He did not receive any transfusions.  There was no acute bleed.  His urinalysis showed moderate blood, but no protein. His SED rate is elevated at 114.  SPEP was negative for M-spike, and his UPEP was negative for Bence-Jones protein.  His urine immunofixation shows polyclonal increase in the free kappa and free lambda light chains.  A new smear is  pending, to evaluate for rouleaux formation. Currently, his gammaglobulin is 10.6.  Bone scan was negative for bony metastasis.  His CT of the chest, abdomen and pelvis on 08/29 without contrast was remarkable for tiny 3 to 4 mm nodules primarily in the right upper lobe, of unknown significance.  The rest of the CT of the chest, abdomen and pelvis was negative for malignancy.  Based on these findings, we were asked to see the patient in consultation, with recommendations in order to rule out the possibility of multiple myeloma.  PAST MEDICAL HISTORY: 1. Hypertension. 2. Hyperlipidemia. 3. CHF. 4. Atrial fibrillation. 5. Rheumatoid arthritis, on methotrexate 15 mg every 7 days, due for a     dose on August 22, 2011. 6. Allergic rhinitis. 7. History of prostate cancer, as above. 8. Remote tobacco. 9. History of microcytic anemia requiring admission in April 2012,     with treatment consisting of 2 units of packed RBCs. 10.History of iron deficiency, on iron.  PAST SURGICAL HISTORY:  Status post bilateral knee replacement, status post cataract surgery, status post facial surgery as a teenager after a football accident, status post TURP.  ALLERGIES:  No  known drug allergies.  MEDICATIONS:  Lovenox, ferrous sulfate, Lasix, methotrexate, Remeron, Lovaza, Prilosec, Tylenol, Benadryl, Norco, and Zofran.  REVIEW OF SYSTEMS:  Remarkable for dyspnea on exertion, intermittent chest pain, weight loss, fatigue.  No neuropathy.  No fever, chills or night sweats.  No headaches, mental status changes, although he is very lethargic.  No abdominal pain.  Denies any blood in the stools or in the urine.  No quinine products.  No ice chips.  No caffeine.  Rest of the review of systems is negative.  FAMILY HISTORY:  Noncontributory.  SOCIAL HISTORY:  The patient is married.  Lived in [place] until recently, now he lives in [place].  Quit 15 years ago the use of cigarettes.  No  alcohol or recreational drug use.  PHYSICAL EXAM:  GENERAL:  This is a debilitated 77 year old white male in no acute distress, but lethargic. VITAL SIGNS:  Blood pressure 119/62, pulse 69, respirations 16, temperature 98.8, O2 saturations 92% In room air.  Weight 71.2 kg, height 74 inches. HEENT:  Normocephalic.  PERRLA.  Oral cavity without thrush or lesions. NECK:  Supple.  No cervical or supraclavicular masses. LUNGS:  Decreased breath sounds bilaterally.  No axillary masses. CARDIOVASCULAR:  Regular rate and rhythm without murmurs, rubs or gallops. ABDOMEN:  Soft, nontender.  Bowel sounds x4.  No hepatosplenomegaly. GENITOURINARY/RECTAL:  Deferred. EXTREMITIES:  No clubbing or cyanosis.  No edema.  No inguinal masses. SKIN:  Without petechial rash or open lesions. NEUROLOGIC:  As mentioned above, the patient is lethargic, but he is able to follow commands appropriately.  Exam is essentially nonfocal.  PERTINENT LABORATORY AND X-RAY DATA:  Last hemoglobin 8.1, hematocrit 23.7, white count 4.7, platelets 266, MCV 99.6.  Folic acid 835, B12 1045.  Sodium 139, potassium 3.5, BUN 30, creatinine 1.5, glucose 92. Total bilirubin 0.8, alkaline phosphatase 192, AST 55, ALT 84, total protein 5.6, albumin 2.7, calcium 12.5.  PTH 30.5.  PSA 0.04.  TSH 0.647, SED rate 119.  Hepatitis panel negative.  UA positive for moderate blood, otherwise negative for protein, leukocytes, bilirubin or nitrites.  MRSA screening negative.  2-D echo pending.  SPEP and UPEP as per HPI.  ASSESSMENT:  Dr. Welton Flakes has seen and evaluated the patient for anemia in the setting of hypercalcemia and renal failure.  Rule out multiple myeloma.  RECOMMENDATIONS: 1. Check SPEP and immunofixation. 2. Quantitative immunoglobulins. 3. Beta-2 microglobulin. 4. Serum light chains. 5. Skeletal survey. 6. Possible bone marrow biopsy and aspiration to look for elevation in     plasma cells.  Thank you very much for  allowing [place] the opportunity to participate in the care of [name].  We will follow on the results and further recommendations are to proceed.  Dictated For:  [name] Second, MD     [name] Kays, PA-C    ___________________ [name], M.D.  SW/MEDQ  D:  08/21/2011  T:  08/21/2011  Job:  [id number]  cc:   Molly Maduro A. Nicholos Johns, M.D. Fax: [phone]  Noralyn Pick. Eden Emms, MD, [facility] [address]  [address] [place] [address]  Christophe Louis, MD [facility] [address] [place], [place] [address]  Electronically Signed by [name] P.A. on 08/24/2011 02:13:39 PM Electronically Signed by [name] Second MD on 10/05/2011 08:43:55 PM

## 2011-10-07 NOTE — Discharge Summary (Signed)
NAMEKRISTON, [name]             ACCOUNT NO.:  [id number]  MEDICAL RECORD NO.:  [id number]  LOCATION:  1306                         FACILITY:  [facility]  PHYSICIAN:  [name] Idrovo I Michaelene Dutan, MD      DATE OF BIRTH:  06-18-1930  DATE OF ADMISSION:  09/06/2011 DATE OF DISCHARGE:  09/08/2011                              DISCHARGE SUMMARY   DISCHARGE DIAGNOSES: 1. Progressive weakness. 2. Failure to thrive. 3. Recent history of pneumonia, complete treatment with Avelox. 4. Chronic systolic congestive heart failure with ejection fraction     35% to 40%, patient currently asymptomatic. 5. Acute renal failure felt to be secondary to over-diuresis as the     patient currently euvolemic and asymptomatic. 6. Thrombocytosis. 7. Anemia of chronic disease, on a weekly Aranesp. 8. History of rheumatoid arthritis, methotrexate on hold secondary to     transaminitis.  DISCHARGE MEDICATIONS: 1. Aranesp 25 mcg injection weekly. 2. Lipitor 20 mg daily. 3. Diphenhydramine 25 mg twice daily as needed. 4. Ferrous sulfate 325 mg daily. 5. Fish oil 1000 mg daily. 6. Furosemide 40 mg daily to be started on September 10, 2011. 7. Immune Health plain capsule one daily. 8. __________ mg p.o. daily. 9. Mirtazapine 15 mg at bedtime. 10.Omeprazole 20 mg twice daily. 11.Prostavit 1 capsule daily. 12.Tylenol every 6 hours as needed. 13.Vicks Vapor inhaler every 4 hours as needed.  CONSULTATION:  None.  FOLLOWUP:  The patient needs to check BMETs next week and every week. The patient will be on Lasix.  Monitor input and output.  PROCEDURE:  Chest x-ray:  Mild cardiomegaly, diffuse interstitial obesity consistent with interstitial pulmonary edema.  This patient is a 77 year old gentleman with multiple medical problems. He was recently hospitalized from August 18, 2011 until September 05, 2011 for his hypercalcemia and pyelonephritis, which the patient completed course of antibiotics and anemia of chronic  disease. Apparently, the patient offered to be discharged to the nursing home. Family would like him to go home, but on the date of discharge, he developed more profound weakness to the point where he was unable to ambulate or rise from a sitting position off the commode.  He was brought by his spouse.  She was unable to take care of the patient due to his profound weakness.  The original plan was to discharge the patient to rehab as it is while I felt that he could handle his acute care needs at home temporarily.  Bed is available at [facility].  ED evaluation, stable vital signs.  No shortness of breath.  No chest pain. Chest x-ray was suggested some pulmonary edema.  White blood cell was mildly elevated at 13.5.  Troponin within normal.  Admitted for failure to thrive and profound weakness. Accordingly, the patient received Lasix in emergency room and his creatinine found to be elevated.  Profound weakness and failure to thrive, the patient will be discharged to the rehab. 1. Recent pneumonia.  The patient completed dose of Avelox.  The     patient still has white blood cells of 14.5, but no fever.  The     patient denies any complaint at this time, so I will ask the  nursing home to monitor that closely. 2. History of congestive heart failure with EF 35%.  Chest x-ray     suggested increased interstitial marking.  The patient received     furosemide dose at home, an additional dose in the ED and he     clinically appeared to be euvolemic, and he does not look to me in     shortness of breath or respiratory distress.  He looks euvolemic.     Accordingly, we will hold furosemide today and tomorrow and to be     started on September 10, 2011, and we need to check his BUN and     creatinine accordingly.  Also, we need to monitor his input and     daily output.  Currently, the patient denies any shortness of     breath.  Denies any chest pain, denies any weakness.  No nausea, no      vomiting.  PHYSICAL EXAMINATION:  NECK:  No JVD.  No oral thrush. HEART:  S1, and S2 with no added sound. LUNG EXAMINATION:  Normal vesicular breathing with equal entry. ABDOMEN:  Soft, nontender.  Bowel sounds positive. EXTREMITIES:  Without edema and the patient moves all his extremities.  Currently, we felt the patient is medically stable for discharge.  The patient will be discharged to SNF with physical therapy.     [name] Pfarr Bosie Helper, MD     HIE/MEDQ  D:  09/08/2011  T:  09/08/2011  Job:  [id number]  cc:   [name], M.D. Fax: [phone]  Electronically Signed by [name] Cargo MD on 10/07/2011 02:06:52 PM

## 2011-12-18 ENCOUNTER — Other Ambulatory Visit: Payer: Self-pay | Admitting: Family Medicine

## 2011-12-18 ENCOUNTER — Ambulatory Visit
Admission: RE | Admit: 2011-12-18 | Discharge: 2011-12-18 | Disposition: A | Discharge status: Home / Self Care | Payer: Medicare Other | Source: Ambulatory Visit | Attending: Family Medicine | Admitting: Family Medicine

## 2011-12-18 DIAGNOSIS — M25512 Pain in left shoulder: Secondary | ICD-10-CM

## 2012-11-24 ENCOUNTER — Encounter (HOSPITAL_COMMUNITY): Payer: Self-pay | Admitting: Emergency Medicine

## 2012-11-24 ENCOUNTER — Emergency Department (INDEPENDENT_AMBULATORY_CARE_PROVIDER_SITE_OTHER): Payer: Medicare Other

## 2012-11-24 ENCOUNTER — Emergency Department (INDEPENDENT_AMBULATORY_CARE_PROVIDER_SITE_OTHER)
Admission: EM | Admit: 2012-11-24 | Discharge: 2012-11-24 | Disposition: A | Discharge status: Home / Self Care | Payer: Medicare Other | Source: Home / Self Care | Attending: Emergency Medicine | Admitting: Emergency Medicine

## 2012-11-24 DIAGNOSIS — R0789 Other chest pain: Secondary | ICD-10-CM

## 2012-11-24 MED ORDER — TRAMADOL-ACETAMINOPHEN 37.5-325 MG PO TABS: 1.0000 | ORAL_TABLET | Freq: Four times a day (QID) | ORAL | Status: AC | PRN

## 2012-11-24 NOTE — ED Provider Notes (Addendum)
Chief Complaint  Patient presents with  . Chest Pain    History of Present Illness:   [name] is an 78 year old male who lifted a heavy amplifier 3 days ago. Ever since then he's had pain in his right pectoral area which is worse if he raises the arm and is tender to touch in that area. He denies any pain with deep inspiration, coughing, wheezing, or shortness of breath. He has not had any fever or chills. He denies any palpitations, dizziness, syncope, nausea, or diaphoresis. He's had no bowel pain or vomiting. The patient has a history of coronary artery disease. He is on number of medicines, but we were not able to get the medication list from the pharmacy.  Review of Systems:  Other than noted above, the patient denies any of the following symptoms. Systemic:  No fever, chills, sweats, or fatigue. ENT:  No nasal congestion, rhinorrhea, or sore throat. Pulmonary:  No cough, wheezing, shortness of breath, sputum production, hemoptysis. Cardiac:  No palpitations, rapid heartbeat, dizziness, presyncope or syncope. GI:  No abdominal pain, heartburn, nausea, or vomiting. Ext:  No leg pain or swelling.  PMFSH:  Past medical history, family history, social history, meds, and allergies were reviewed and updated as needed.   Physical Exam:   Vital signs:  BP 124/54  Pulse 55  Temp 97.9 F (36.6 C) (Oral)  Resp 16  SpO2 100% Gen:  Alert, oriented, in no distress, skin warm and dry. Eye:  PERRL, lids and conjunctivas normal.  Sclera non-icteric. ENT:  Mucous membranes moist, pharynx clear. Neck:  Supple, no adenopathy or tenderness.  No JVD. Lungs:  Clear to auscultation, no wheezes, rales or rhonchi.  No respiratory distress. Heart:  Regular rhythm.  No gallops, murmers, clicks or rubs. Chest:  He has localized chest wall tenderness to palpation in the right pectoral area, no swelling, bruising, or deformity. Abdomen:  Soft, nontender, no organomegaly or mass.  Bowel sounds normal.  No  pulsatile abdominal mass or bruit. Ext:  No edema.  No calf tenderness and Homann's sign negative.  Pulses full and equal. Skin:  Warm and dry.  No rash.   Radiology:  Dg Ribs Unilateral W/chest Right  11/24/2012  *RADIOLOGY REPORT*  Clinical Data: Right anterior chest pain secondary to a lifting injury.  RIGHT RIBS AND CHEST - 3+ VIEW  Comparison: Chest x-ray dated 09/06/2011  Findings: The heart size and pulmonary vascularity are normal and the lungs are clear.  The right ribs appear normal.  Old healed fracture of the posterior aspect of the left ninth rib.  No pleural effusions or lung contusions.  IMPRESSION: No acute abnormality.   Original Report Authenticated By: Francene Boyers, M.D.    I reviewed the images independently and personally and concur with the radiologist's findings.  EKG:   Date: 11/24/2012  Rate: 54  Rhythm: sinus bradycardia  QRS Axis: left  Intervals: normal  ST/T Wave abnormalities: nonspecific T wave changes  Conduction Disutrbances:none  Narrative Interpretation: Sinus bradycardia, nonspecific T wave abnormalities which are very minimal.  Old EKG Reviewed: none available  Assessment:  The encounter diagnosis was Musculoskeletal chest pain.   Plan:   1.  The following meds were prescribed:   New Prescriptions   TRAMADOL-ACETAMINOPHEN (ULTRACET) 37.5-325 MG PER TABLET    Take 1 tablet by mouth every 6 (six) hours as needed for pain.   2.  The patient was instructed in symptomatic care and handouts were given. 3.  The patient  was told to return if becoming worse in any way, if no better in 3 or 4 days, and given some red flag symptoms that would indicate earlier return.    Reuben Likes, MD 11/24/12 [name]  Reuben Likes, MD 11/24/12 [phone]

## 2012-11-24 NOTE — ED Notes (Signed)
Pt  Reports  r  Sided   Chest  Pain  Which  Radiates    Across   Chest    X  24  Hrs     Pt  Reports  He  Lifted  Heavy         Object  2  Days  Ago         Pt  Reports  Pain  Worse  When  He  Raises  His  Arm  Up  Pt  Has  Pain on  Palpation r  Upper  Chest  Pain is  Described  As  A  Throbbing

## 2014-06-25 ENCOUNTER — Ambulatory Visit (INDEPENDENT_AMBULATORY_CARE_PROVIDER_SITE_OTHER): Payer: Medicare Other | Admitting: Interventional Cardiology

## 2014-06-25 ENCOUNTER — Encounter: Payer: Self-pay | Admitting: Interventional Cardiology

## 2014-06-25 ENCOUNTER — Ambulatory Visit (HOSPITAL_COMMUNITY): Payer: Medicare Other | Attending: Interventional Cardiology | Admitting: Cardiology

## 2014-06-25 VITALS — BP 108/71 | HR 60 | Ht 74.0 in | Wt 190.0 lb

## 2014-06-25 DIAGNOSIS — R0989 Other specified symptoms and signs involving the circulatory and respiratory systems: Secondary | ICD-10-CM

## 2014-06-25 DIAGNOSIS — R413 Other amnesia: Secondary | ICD-10-CM | POA: Insufficient documentation

## 2014-06-25 DIAGNOSIS — I6529 Occlusion and stenosis of unspecified carotid artery: Secondary | ICD-10-CM

## 2014-06-25 DIAGNOSIS — E782 Mixed hyperlipidemia: Secondary | ICD-10-CM

## 2014-06-25 DIAGNOSIS — R072 Precordial pain: Secondary | ICD-10-CM

## 2014-06-25 DIAGNOSIS — I10 Essential (primary) hypertension: Secondary | ICD-10-CM

## 2014-06-25 DIAGNOSIS — E785 Hyperlipidemia, unspecified: Secondary | ICD-10-CM | POA: Insufficient documentation

## 2014-06-25 NOTE — Progress Notes (Signed)
Carotid duplex performed.  RICA high end of 60-79% range, discussed with Dr. Irish Lack

## 2014-06-25 NOTE — Progress Notes (Signed)
Patient ID: [name], male   DOB: [birth year], 81 y.o.   MRN: [id number]     Patient ID: [name] MRN: [id number] DOB/AGE: 11-08-1930 80 y.o.   Referring Physician Dr. Kenton Kingfisher   Reason for Consultation chest pain  HPI: 80 y/o who has had AFib in the past.  He has had CP for the last few months.  It occurs several times a week, usually in the evening, not related to exertion.  No regular walking.  Playing a guitar is the most strenuous activity.  He has not had any chest pain after that.  No associated nausea, vomiting or diaphoresis.  No palpitations or syncope reported.  No prior stress test.    He has not taken any kind of blood thinner.  He was hospitalized for hypercalcemia and anemia in 2012.  He was noted to have a poor memory at that time.     Current Outpatient Prescriptions  Medication Sig Dispense Refill  . aspirin 81 MG tablet Take 81 mg by mouth daily.      Marland Kitchen atorvastatin (LIPITOR) 20 MG tablet Take 20 mg by mouth daily.      . ferrous sulfate 325 (65 FE) MG EC tablet Take 325 mg by mouth daily.      . furosemide (LASIX) 40 MG tablet Take 40 mg by mouth daily.      Marland Kitchen losartan (COZAAR) 50 MG tablet Take 50 mg by mouth daily.      Marland Kitchen omeprazole (PRILOSEC) 20 MG capsule Take 20 mg by mouth daily.      . traMADol-acetaminophen (ULTRACET) 37.5-325 MG per tablet Take 1 tablet by mouth every 6 (six) hours as needed for pain.  30 tablet  0   No current facility-administered medications for this visit.   No past medical history on file.  No family history on file.  History   Social History  . Marital Status: Married    Spouse Name: N/A    Number of Children: N/A  . Years of Education: N/A   Occupational History  . Not on file.   Social History Main Topics  . Smoking status: Never Smoker   . Smokeless tobacco: Not on file  . Alcohol Use: No  . Drug Use:   . Sexual Activity:    Other Topics Concern  . Not on file   Social History Narrative  . No  narrative on file    Past Surgical History  Procedure Laterality Date  . Prostate surgery    . Knee surgery        (Not in a hospital admission)  Review of systems complete and found to be negative unless listed above .  No nausea, vomiting.  No fever chills, No focal weakness,  No palpitations.  Physical Exam: Filed Vitals:   06/25/14 0913  BP: 108/71  Pulse: 60    Weight: 190 lb (86.183 kg)  Physical exam:  [place]/AT EOMI No JVD, Right carotid bruit 2/6 RRR S1S2  No wheezing Soft. NT, nondistended No edema. No focal motor or sensory deficits Normal affect  Labs:   Lab Results  Component Value Date   WBC 14.5* 09/08/2011   HGB 8.4* 09/08/2011   HCT 24.9* 09/08/2011   MCV 95.8 09/08/2011   PLT 688* 09/08/2011   No results found for this basename: NA, K, CL, CO2, BUN, CREATININE, CALCIUM, LABALBU, PROT, BILITOT, ALKPHOS, ALT, AST, GLUCOSE,  in the last 168 hours Lab Results  Component Value Date  CKTOTAL 60 09/07/2011   CKMB 2.1 09/07/2011   TROPONINI <0.30 09/07/2011    Lab Results  Component Value Date   CHOL  Value: 91        ATP III CLASSIFICATION:  <200     mg/dL   Desirable  200-239  mg/dL   Borderline High  >=240    mg/dL   High        04/02/2011   Lab Results  Component Value Date   HDL 32* 04/02/2011   Lab Results  Component Value Date   LDLCALC  Value: 43        Total Cholesterol/HDL:CHD Risk Coronary Heart Disease Risk Table                     Men   Women  1/2 Average Risk   3.4   3.3  Average Risk       5.0   4.4  2 X Average Risk   9.6   7.1  3 X Average Risk  23.4   11.0        Use the calculated Patient Ratio above and the CHD Risk Table to determine the patient's CHD Risk.        ATP III CLASSIFICATION (LDL):  <100     mg/dL   Optimal  100-129  mg/dL   Near or Above                    Optimal  130-159  mg/dL   Borderline  160-189  mg/dL   High  >190     mg/dL   Very High 04/02/2011   Lab Results  Component Value Date   TRIG 81 04/02/2011   Lab Results    Component Value Date   CHOLHDL 2.8 04/02/2011   No results found for this basename: LDLDIRECT       EKG: Normal  ASSESSMENT AND PLAN: Chest pain, right carotid bruit, hyperlipidemia, HTN  1) Chest pain:  Somewhat atypical.  Plan for Lexiscan stress test due to RF>  He is unable to walk the treadmill.    2) Plan carotid Doppler for bruit.   3) Statin for hyperlipidemia.  LDL 90.   4) Continue BP meds.  Mild renal insufficiency.  Stable-on ARB.   Mina Marble, MD, [facility] 06/25/2014, 9:30 AM

## 2014-06-25 NOTE — Patient Instructions (Signed)
Your physician has requested that you have a carotid duplex. This test is an ultrasound of the carotid arteries in your neck. It looks at blood flow through these arteries that supply the brain with blood. Allow one hour for this exam. There are no restrictions or special instructions.  Your physician has requested that you have a lexiscan myoview. For further information please visit [email or web address]. Please follow instruction sheet, as given.

## 2014-07-03 ENCOUNTER — Ambulatory Visit (HOSPITAL_COMMUNITY): Payer: Medicare Other | Attending: Cardiology | Admitting: Radiology

## 2014-07-03 VITALS — BP 127/71 | HR 50 | Ht 74.0 in | Wt 187.0 lb

## 2014-07-03 DIAGNOSIS — R072 Precordial pain: Secondary | ICD-10-CM

## 2014-07-03 DIAGNOSIS — R079 Chest pain, unspecified: Secondary | ICD-10-CM

## 2014-07-03 MED ORDER — REGADENOSON 0.4 MG/5ML IV SOLN
0.4000 mg | Freq: Once | INTRAVENOUS | Status: AC
  Administered 2014-07-03: 0.4 mg via INTRAVENOUS

## 2014-07-03 MED ORDER — TECHNETIUM TC 99M SESTAMIBI GENERIC - CARDIOLITE
30.0000 | Freq: Once | INTRAVENOUS | Status: AC | PRN
  Administered 2014-07-03: 30 via INTRAVENOUS

## 2014-07-03 MED ORDER — TECHNETIUM TC 99M SESTAMIBI GENERIC - CARDIOLITE
10.0000 | Freq: Once | INTRAVENOUS | Status: AC | PRN
  Administered 2014-07-03: 10 via INTRAVENOUS

## 2014-07-03 NOTE — Progress Notes (Signed)
[facility] [address] [place], [place] [address] [phone]    Cardiology Nuclear Med Study  [name] is a 80 y.o. male     MRN : [id number]     DOB: Mar 31, 1930  Procedure Date: 07/03/2014  Nuclear Med Background Indication for Stress Test:  Evaluation for Ischemia History:  No known CAD, hx. afib Cardiac Risk Factors: Carotid Disease, History of Smoking, Hypertension and Lipids  Symptoms:  Chest Pain (currently, slight) and Dizziness   Nuclear Pre-Procedure Caffeine/Decaff Intake:  None NPO After: 7:00pm   Lungs:  clear O2 Sat: 96% on room air. IV 0.9% NS with Angio Cath:  22g  IV Site: R Hand  IV Started by:  Matilde Haymaker, RN  Chest Size (in):  44 Cup Size: n/a  Height: 6\' 2"  (1.88 m)  Weight:  187 lb (84.823 kg)  BMI:  Body mass index is 24 kg/(m^2). Tech Comments:  n/a    Nuclear Med Study 1 or 2 day study: 1 day  Stress Test Type:  Lexiscan  Reading MD: n/a  Order Authorizing Provider:  Rowan Blase  Resting Radionuclide: Technetium 20m Sestamibi  Resting Radionuclide Dose: 11.0 mCi   Stress Radionuclide:  Technetium 80m Sestamibi  Stress Radionuclide Dose: 33.0 mCi           Stress Protocol Rest HR: 50 Stress HR: 64  Rest BP: 127/71 Stress BP: 121/60  Exercise Time (min): n/a METS: n/a           Dose of Adenosine (mg):  n/a Dose of Lexiscan: 0.4 mg  Dose of Atropine (mg): n/a Dose of Dobutamine: n/a mcg/kg/min (at max HR)  Stress Test Technologist: Glade Lloyd, BS-ES  Nuclear Technologist:  Vedia Pereyra, CNMT     Rest Procedure:  Myocardial perfusion imaging was performed at rest 45 minutes following the intravenous administration of Technetium 72m Sestamibi. Rest ECG: NSR - Normal EKG  Stress Procedure:  The patient received IV Lexiscan 0.4 mg over 15-seconds.  Technetium 19m Sestamibi injected at 30-seconds.  Quantitative spect images were obtained after a 45 minute delay.  During the infusion of  Lexiscan the patient complained of head pressure that began to resolve in recovery.  Stress ECG: No significant change from baseline ECG  QPS Raw Data Images:  Mild diaphragmatic attenuation.  Normal left ventricular size. Stress Images:  Basal to mid inferolateral defect Rest Images:  Basal to mid inferolateral defect Subtraction (SDS):  No evidence of ischemia. Transient Ischemic Dilatation (Normal <1.22):  1.18 Lung/Heart Ratio (Normal <0.45):  0.31  Quantitative Gated Spect Images QGS EDV:  171 ml QGS ESV:  98 ml  Impression Exercise Capacity:  Lexiscan with no exercise. BP Response:  Normal blood pressure response. Clinical Symptoms:  No significant symptoms noted. ECG Impression:  No significant ECG changes with Lexiscan. Comparison with Prior Nuclear Study: No previous nuclear study performed  Overall Impression:  Intermediate risk stress nuclear study with fixed basal to mid inferolateral scar. No ischemia.  LV Ejection Fraction: 43%.  LV Wall Motion:  inferolateral hypokinesis  Pixie Casino, MD, [facility] Board Certified in Nuclear Cardiology Attending Cardiologist [facility]

## 2014-07-04 ENCOUNTER — Encounter (HOSPITAL_COMMUNITY): Payer: Medicare Other

## 2014-12-08 ENCOUNTER — Inpatient Hospital Stay (HOSPITAL_COMMUNITY)
Admission: EM | Admit: 2014-12-08 | Discharge: 2014-12-21 | DRG: 286 | Disposition: E | Payer: Medicare Other | Attending: Cardiology | Admitting: Cardiology

## 2014-12-08 ENCOUNTER — Inpatient Hospital Stay (HOSPITAL_COMMUNITY): Payer: Medicare Other

## 2014-12-08 ENCOUNTER — Emergency Department (HOSPITAL_COMMUNITY): Payer: Medicare Other

## 2014-12-08 ENCOUNTER — Encounter (HOSPITAL_COMMUNITY): Admission: EM | Disposition: E | Payer: Self-pay | Source: Home / Self Care | Attending: Cardiology

## 2014-12-08 ENCOUNTER — Encounter (HOSPITAL_COMMUNITY): Payer: Self-pay | Admitting: Cardiology

## 2014-12-08 ENCOUNTER — Ambulatory Visit (HOSPITAL_COMMUNITY): Admit: 2014-12-08 | Payer: Self-pay | Admitting: Cardiology

## 2014-12-08 DIAGNOSIS — J9600 Acute respiratory failure, unspecified whether with hypoxia or hypercapnia: Secondary | ICD-10-CM

## 2014-12-08 DIAGNOSIS — R0600 Dyspnea, unspecified: Secondary | ICD-10-CM | POA: Diagnosis present

## 2014-12-08 DIAGNOSIS — G931 Anoxic brain damage, not elsewhere classified: Secondary | ICD-10-CM | POA: Diagnosis present

## 2014-12-08 DIAGNOSIS — Z515 Encounter for palliative care: Secondary | ICD-10-CM | POA: Diagnosis not present

## 2014-12-08 DIAGNOSIS — I4901 Ventricular fibrillation: Secondary | ICD-10-CM | POA: Diagnosis present

## 2014-12-08 DIAGNOSIS — J9601 Acute respiratory failure with hypoxia: Secondary | ICD-10-CM | POA: Diagnosis present

## 2014-12-08 DIAGNOSIS — N179 Acute kidney failure, unspecified: Secondary | ICD-10-CM | POA: Diagnosis present

## 2014-12-08 DIAGNOSIS — Z66 Do not resuscitate: Secondary | ICD-10-CM | POA: Diagnosis present

## 2014-12-08 DIAGNOSIS — R739 Hyperglycemia, unspecified: Secondary | ICD-10-CM | POA: Diagnosis present

## 2014-12-08 DIAGNOSIS — N170 Acute kidney failure with tubular necrosis: Secondary | ICD-10-CM | POA: Diagnosis present

## 2014-12-08 DIAGNOSIS — I2510 Atherosclerotic heart disease of native coronary artery without angina pectoris: Secondary | ICD-10-CM | POA: Diagnosis present

## 2014-12-08 DIAGNOSIS — E872 Acidosis: Secondary | ICD-10-CM | POA: Diagnosis present

## 2014-12-08 DIAGNOSIS — I712 Thoracic aortic aneurysm, without rupture: Secondary | ICD-10-CM | POA: Diagnosis present

## 2014-12-08 DIAGNOSIS — I469 Cardiac arrest, cause unspecified: Secondary | ICD-10-CM | POA: Diagnosis present

## 2014-12-08 DIAGNOSIS — I7121 Aneurysm of the ascending aorta, without rupture: Secondary | ICD-10-CM | POA: Diagnosis present

## 2014-12-08 DIAGNOSIS — R570 Cardiogenic shock: Secondary | ICD-10-CM | POA: Diagnosis present

## 2014-12-08 DIAGNOSIS — I472 Ventricular tachycardia: Secondary | ICD-10-CM | POA: Diagnosis present

## 2014-12-08 DIAGNOSIS — E785 Hyperlipidemia, unspecified: Secondary | ICD-10-CM | POA: Diagnosis present

## 2014-12-08 DIAGNOSIS — J189 Pneumonia, unspecified organism: Secondary | ICD-10-CM | POA: Diagnosis present

## 2014-12-08 DIAGNOSIS — I129 Hypertensive chronic kidney disease with stage 1 through stage 4 chronic kidney disease, or unspecified chronic kidney disease: Secondary | ICD-10-CM | POA: Diagnosis present

## 2014-12-08 DIAGNOSIS — I4891 Unspecified atrial fibrillation: Secondary | ICD-10-CM | POA: Diagnosis present

## 2014-12-08 DIAGNOSIS — R4020 Unspecified coma: Secondary | ICD-10-CM | POA: Diagnosis present

## 2014-12-08 DIAGNOSIS — Z9289 Personal history of other medical treatment: Secondary | ICD-10-CM

## 2014-12-08 DIAGNOSIS — E876 Hypokalemia: Secondary | ICD-10-CM | POA: Diagnosis present

## 2014-12-08 DIAGNOSIS — J9690 Respiratory failure, unspecified, unspecified whether with hypoxia or hypercapnia: Secondary | ICD-10-CM

## 2014-12-08 DIAGNOSIS — R0689 Other abnormalities of breathing: Secondary | ICD-10-CM

## 2014-12-08 DIAGNOSIS — T884XXA Failed or difficult intubation, initial encounter: Secondary | ICD-10-CM

## 2014-12-08 DIAGNOSIS — N183 Chronic kidney disease, stage 3 (moderate): Secondary | ICD-10-CM | POA: Diagnosis present

## 2014-12-08 DIAGNOSIS — F419 Anxiety disorder, unspecified: Secondary | ICD-10-CM | POA: Diagnosis present

## 2014-12-08 DIAGNOSIS — D649 Anemia, unspecified: Secondary | ICD-10-CM | POA: Diagnosis present

## 2014-12-08 DIAGNOSIS — R451 Restlessness and agitation: Secondary | ICD-10-CM | POA: Diagnosis not present

## 2014-12-08 HISTORY — DX: Peripheral vascular disease, unspecified: I73.9

## 2014-12-08 HISTORY — DX: Hyperlipidemia, unspecified: E78.5

## 2014-12-08 HISTORY — DX: Chronic kidney disease, stage 3 unspecified: N18.30

## 2014-12-08 HISTORY — DX: Essential (primary) hypertension: I10

## 2014-12-08 HISTORY — PX: LEFT HEART CATHETERIZATION WITH CORONARY ANGIOGRAM: SHX5451

## 2014-12-08 HISTORY — DX: Disorder of arteries and arterioles, unspecified: I77.9

## 2014-12-08 HISTORY — DX: Chronic kidney disease, stage 3 (moderate): N18.3

## 2014-12-08 LAB — GLUCOSE, CAPILLARY
GLUCOSE-CAPILLARY: 233 mg/dL — AB (ref 70–99)
Glucose-Capillary: 208 mg/dL — ABNORMAL HIGH (ref 70–99)
Glucose-Capillary: 228 mg/dL — ABNORMAL HIGH (ref 70–99)
Glucose-Capillary: 206 mg/dL — ABNORMAL HIGH (ref 70–99)

## 2014-12-08 LAB — I-STAT CHEM 8, ED
BUN: 32 mg/dL — ABNORMAL HIGH (ref 6–23)
CALCIUM ION: 1.17 mmol/L (ref 1.13–1.30)
CHLORIDE: 105 meq/L (ref 96–112)
Creatinine, Ser: 1.4 mg/dL — ABNORMAL HIGH (ref 0.50–1.35)
Glucose, Bld: 264 mg/dL — ABNORMAL HIGH (ref 70–99)
HEMATOCRIT: 36 % — AB (ref 39.0–52.0)
Hemoglobin: 12.2 g/dL — ABNORMAL LOW (ref 13.0–17.0)
Potassium: 3.6 mEq/L — ABNORMAL LOW (ref 3.7–5.3)
Sodium: 138 mEq/L (ref 137–147)
TCO2: 16 mmol/L (ref 0–100)

## 2014-12-08 LAB — CBC
HCT: 35.5 % — ABNORMAL LOW (ref 39.0–52.0)
HEMATOCRIT: 33.6 % — AB (ref 39.0–52.0)
Hemoglobin: 10.5 g/dL — ABNORMAL LOW (ref 13.0–17.0)
Hemoglobin: 11.4 g/dL — ABNORMAL LOW (ref 13.0–17.0)
MCH: 29.6 pg (ref 26.0–34.0)
MCH: 30.4 pg (ref 26.0–34.0)
MCHC: 31.3 g/dL (ref 30.0–36.0)
MCHC: 32.1 g/dL (ref 30.0–36.0)
MCV: 94.6 fL (ref 78.0–100.0)
MCV: 94.7 fL (ref 78.0–100.0)
PLATELETS: 428 10*3/uL — AB (ref 150–400)
Platelets: 294 10*3/uL (ref 150–400)
RBC: 3.55 MIL/uL — AB (ref 4.22–5.81)
RBC: 3.75 MIL/uL — ABNORMAL LOW (ref 4.22–5.81)
RDW: 17 % — AB (ref 11.5–15.5)
RDW: 16.9 % — AB (ref 11.5–15.5)
WBC: 9.5 10*3/uL (ref 4.0–10.5)
WBC: 27.2 10*3/uL — ABNORMAL HIGH (ref 4.0–10.5)

## 2014-12-08 LAB — DIFFERENTIAL
BASOS ABS: 0.1 10*3/uL (ref 0.0–0.1)
Basophils Relative: 1 % (ref 0–1)
EOS PCT: 3 % (ref 0–5)
Eosinophils Absolute: 0.3 10*3/uL (ref 0.0–0.7)
LYMPHS ABS: 3.7 10*3/uL (ref 0.7–4.0)
LYMPHS PCT: 39 % (ref 12–46)
MONOS PCT: 7 % (ref 3–12)
Monocytes Absolute: 0.7 10*3/uL (ref 0.1–1.0)
Neutro Abs: 4.7 10*3/uL (ref 1.7–7.7)
Neutrophils Relative %: 50 % (ref 43–77)
WBC Morphology: INCREASED

## 2014-12-08 LAB — BASIC METABOLIC PANEL
ANION GAP: 20 — AB (ref 5–15)
Anion gap: 20 — ABNORMAL HIGH (ref 5–15)
Anion gap: 20 — ABNORMAL HIGH (ref 5–15)
Anion gap: 16 — ABNORMAL HIGH (ref 5–15)
BUN: 25 mg/dL — AB (ref 6–23)
BUN: 29 mg/dL — AB (ref 6–23)
BUN: 30 mg/dL — AB (ref 6–23)
BUN: 32 mg/dL — AB (ref 6–23)
CALCIUM: 8.3 mg/dL — AB (ref 8.4–10.5)
CALCIUM: 8.5 mg/dL (ref 8.4–10.5)
CALCIUM: 8.5 mg/dL (ref 8.4–10.5)
CO2: 17 mEq/L — ABNORMAL LOW (ref 19–32)
CO2: 18 mEq/L — ABNORMAL LOW (ref 19–32)
CO2: 18 mEq/L — ABNORMAL LOW (ref 19–32)
CO2: 21 mEq/L (ref 19–32)
Calcium: 8.8 mg/dL (ref 8.4–10.5)
Chloride: 99 mEq/L (ref 96–112)
Chloride: 99 mEq/L (ref 96–112)
Chloride: 100 mEq/L (ref 96–112)
Chloride: 100 mEq/L (ref 96–112)
Creatinine, Ser: 1.34 mg/dL (ref 0.50–1.35)
Creatinine, Ser: 1.46 mg/dL — ABNORMAL HIGH (ref 0.50–1.35)
Creatinine, Ser: 1.46 mg/dL — ABNORMAL HIGH (ref 0.50–1.35)
Creatinine, Ser: 1.49 mg/dL — ABNORMAL HIGH (ref 0.50–1.35)
GFR calc Af Amer: 49 mL/min — ABNORMAL LOW (ref >90)
GFR calc non Af Amer: 43 mL/min — ABNORMAL LOW (ref >90)
GFR calc non Af Amer: 43 mL/min — ABNORMAL LOW (ref >90)
GFR calc non Af Amer: 42 mL/min — ABNORMAL LOW (ref >90)
GFR, EST AFRICAN AMERICAN: 55 mL/min — AB (ref >90)
GFR, EST AFRICAN AMERICAN: 50 mL/min — AB (ref >90)
GFR, EST AFRICAN AMERICAN: 50 mL/min — AB (ref >90)
GFR, EST NON AFRICAN AMERICAN: 48 mL/min — AB (ref >90)
GLUCOSE: 251 mg/dL — AB (ref 70–99)
GLUCOSE: 255 mg/dL — AB (ref 70–99)
GLUCOSE: 234 mg/dL — AB (ref 70–99)
Glucose, Bld: 285 mg/dL — ABNORMAL HIGH (ref 70–99)
POTASSIUM: 3.4 meq/L — AB (ref 3.7–5.3)
POTASSIUM: 3.9 meq/L (ref 3.7–5.3)
POTASSIUM: 3.9 meq/L (ref 3.7–5.3)
Potassium: 3.1 mEq/L — ABNORMAL LOW (ref 3.7–5.3)
SODIUM: 137 meq/L (ref 137–147)
SODIUM: 138 meq/L (ref 137–147)
Sodium: 136 mEq/L — ABNORMAL LOW (ref 137–147)
Sodium: 137 mEq/L (ref 137–147)

## 2014-12-08 LAB — I-STAT TROPONIN, ED: Troponin i, poc: 0.15 ng/mL (ref 0.00–0.08)

## 2014-12-08 LAB — PROTIME-INR
INR: 1.32 (ref 0.00–1.49)
INR: 1.28 (ref 0.00–1.49)
PROTHROMBIN TIME: 16.5 s — AB (ref 11.6–15.2)
Prothrombin Time: 16.1 seconds — ABNORMAL HIGH (ref 11.6–15.2)

## 2014-12-08 LAB — TROPONIN I: Troponin I: 13.79 ng/mL (ref <0.30)

## 2014-12-08 LAB — APTT
APTT: 40 s — AB (ref 24–37)
APTT: 128 s — AB (ref 24–37)

## 2014-12-08 LAB — MRSA PCR SCREENING: MRSA BY PCR: POSITIVE — AB

## 2014-12-08 SURGERY — LEFT HEART CATHETERIZATION WITH CORONARY ANGIOGRAM
Anesthesia: LOCAL

## 2014-12-08 MED ORDER — POTASSIUM CHLORIDE 20 MEQ/15ML (10%) PO SOLN
40.0000 meq | Freq: Once | ORAL | Status: AC
  Administered 2014-12-08: 40 meq
  Filled 2014-12-08: qty 30

## 2014-12-08 MED ORDER — ASPIRIN 300 MG RE SUPP: 300.0000 mg | RECTAL | Status: DC

## 2014-12-08 MED ORDER — MIDAZOLAM HCL 2 MG/2ML IJ SOLN
INTRAMUSCULAR | Status: AC
  Filled 2014-12-08: qty 2

## 2014-12-08 MED ORDER — MIDAZOLAM HCL 5 MG/ML IJ SOLN
1.0000 mg/h | INTRAMUSCULAR | Status: DC
  Administered 2014-12-08: 2 mg/h via INTRAVENOUS
  Filled 2014-12-08: qty 10

## 2014-12-08 MED ORDER — SODIUM CHLORIDE 0.9 % IJ SOLN
3.0000 mL | Freq: Two times a day (BID) | INTRAMUSCULAR | Status: DC
  Administered 2014-12-09 – 2014-12-11 (×5): 3 mL via INTRAVENOUS

## 2014-12-08 MED ORDER — ARTIFICIAL TEARS OP OINT
1.0000 "application " | TOPICAL_OINTMENT | Freq: Three times a day (TID) | OPHTHALMIC | Status: DC
  Administered 2014-12-08 – 2014-12-10 (×5): 1 via OPHTHALMIC
  Filled 2014-12-08 (×2): qty 3.5

## 2014-12-08 MED ORDER — SODIUM CHLORIDE 0.9 % IJ SOLN: 3.0000 mL | INTRAMUSCULAR | Status: DC | PRN

## 2014-12-08 MED ORDER — VERAPAMIL HCL 2.5 MG/ML IV SOLN
INTRAVENOUS | Status: AC
  Filled 2014-12-08: qty 2

## 2014-12-08 MED ORDER — HEPARIN SODIUM (PORCINE) 5000 UNIT/ML IJ SOLN
5000.0000 [IU] | Freq: Three times a day (TID) | INTRAMUSCULAR | Status: DC
  Administered 2014-12-09: 5000 [IU] via SUBCUTANEOUS
  Filled 2014-12-08 (×4): qty 1

## 2014-12-08 MED ORDER — HEPARIN SODIUM (PORCINE) 5000 UNIT/ML IJ SOLN
5000.0000 [IU] | Freq: Three times a day (TID) | INTRAMUSCULAR | Status: DC
Start: 2014-12-09 — End: 2014-12-08

## 2014-12-08 MED ORDER — MIDAZOLAM HCL 2 MG/2ML IJ SOLN: 1.0000 mg | Freq: Once | INTRAMUSCULAR | Status: DC

## 2014-12-08 MED ORDER — SODIUM CHLORIDE 0.9 % IV SOLN
3.0000 g | Freq: Three times a day (TID) | INTRAVENOUS | Status: DC
  Administered 2014-12-08 – 2014-12-11 (×8): 3 g via INTRAVENOUS
  Filled 2014-12-08 (×10): qty 3

## 2014-12-08 MED ORDER — POTASSIUM CHLORIDE 10 MEQ/50ML IV SOLN
10.0000 meq | INTRAVENOUS | Status: AC
  Administered 2014-12-08 – 2014-12-09 (×4): 10 meq via INTRAVENOUS
  Filled 2014-12-08 (×4): qty 50

## 2014-12-08 MED ORDER — CETYLPYRIDINIUM CHLORIDE 0.05 % MT LIQD
7.0000 mL | Freq: Four times a day (QID) | OROMUCOSAL | Status: DC
  Administered 2014-12-09 – 2014-12-18 (×33): 7 mL via OROMUCOSAL

## 2014-12-08 MED ORDER — HEPARIN SODIUM (PORCINE) 1000 UNIT/ML IJ SOLN
INTRAMUSCULAR | Status: AC
Start: 2014-12-08 — End: 2014-12-08
  Filled 2014-12-08: qty 1

## 2014-12-08 MED ORDER — INSULIN ASPART 100 UNIT/ML ~~LOC~~ SOLN
0.0000 [IU] | SUBCUTANEOUS | Status: DC
  Administered 2014-12-08 – 2014-12-09 (×2): 5 [IU] via SUBCUTANEOUS
  Administered 2014-12-09 – 2014-12-11 (×5): 2 [IU] via SUBCUTANEOUS

## 2014-12-08 MED ORDER — CISATRACURIUM BESYLATE 10 MG/ML IV SOLN
1.0000 ug/kg/min | INTRAVENOUS | Status: DC
  Administered 2014-12-08 – 2014-12-09 (×2): 1 ug/kg/min via INTRAVENOUS
  Filled 2014-12-08 (×2): qty 20

## 2014-12-08 MED ORDER — SODIUM CHLORIDE 0.9 % IV SOLN
25.0000 ug/h | INTRAVENOUS | Status: DC
  Administered 2014-12-08: 50 ug/h via INTRAVENOUS
  Filled 2014-12-08: qty 50

## 2014-12-08 MED ORDER — FENTANYL BOLUS VIA INFUSION
25.0000 ug | INTRAVENOUS | Status: DC | PRN
  Filled 2014-12-08: qty 25

## 2014-12-08 MED ORDER — HEPARIN (PORCINE) IN NACL 2-0.9 UNIT/ML-% IJ SOLN
INTRAMUSCULAR | Status: AC
  Filled 2014-12-08: qty 1000

## 2014-12-08 MED ORDER — PANTOPRAZOLE SODIUM 40 MG IV SOLR
40.0000 mg | Freq: Every day | INTRAVENOUS | Status: DC
  Administered 2014-12-08 – 2014-12-10 (×3): 40 mg via INTRAVENOUS
  Filled 2014-12-08 (×5): qty 40

## 2014-12-08 MED ORDER — SODIUM CHLORIDE 0.9 % IV SOLN: 2000.0000 mL | Freq: Once | INTRAVENOUS | Status: DC

## 2014-12-08 MED ORDER — CHLORHEXIDINE GLUCONATE 0.12 % MT SOLN
15.0000 mL | Freq: Two times a day (BID) | OROMUCOSAL | Status: DC
  Administered 2014-12-08 – 2014-12-18 (×17): 15 mL via OROMUCOSAL
  Filled 2014-12-08 (×14): qty 15

## 2014-12-08 MED ORDER — SODIUM CHLORIDE 0.9 % IV SOLN
25.0000 ug/h | INTRAVENOUS | Status: DC
  Administered 2014-12-09: 100 ug/h via INTRAVENOUS
  Filled 2014-12-08 (×2): qty 50

## 2014-12-08 MED ORDER — FENTANYL CITRATE 0.05 MG/ML IJ SOLN: 50.0000 ug | Freq: Once | INTRAMUSCULAR | Status: DC

## 2014-12-08 MED ORDER — CISATRACURIUM BOLUS VIA INFUSION
0.1000 mg/kg | Freq: Once | INTRAVENOUS | Status: DC
  Filled 2014-12-08: qty 8

## 2014-12-08 MED ORDER — NITROGLYCERIN 1 MG/10 ML FOR IR/CATH LAB
INTRA_ARTERIAL | Status: AC
  Filled 2014-12-08: qty 10

## 2014-12-08 MED ORDER — SODIUM CHLORIDE 0.9 % IV SOLN
250.0000 mL | INTRAVENOUS | Status: DC | PRN
  Administered 2014-12-08: 250 mL via INTRAVENOUS

## 2014-12-08 MED ORDER — SODIUM CHLORIDE 0.9 % IV SOLN
1.0000 mg/h | INTRAVENOUS | Status: DC
  Administered 2014-12-09 – 2014-12-10 (×3): 4 mg/h via INTRAVENOUS
  Filled 2014-12-08 (×4): qty 10

## 2014-12-08 MED ORDER — ASPIRIN 300 MG RE SUPP
300.0000 mg | Freq: Once | RECTAL | Status: AC
  Administered 2014-12-08: 300 mg via RECTAL
  Filled 2014-12-08 (×2): qty 1

## 2014-12-08 MED ORDER — CHLORHEXIDINE GLUCONATE CLOTH 2 % EX PADS
6.0000 | MEDICATED_PAD | Freq: Every day | CUTANEOUS | Status: DC
  Administered 2014-12-09 – 2014-12-11 (×3): 6 via TOPICAL

## 2014-12-08 MED ORDER — MUPIROCIN 2 % EX OINT
1.0000 "application " | TOPICAL_OINTMENT | Freq: Two times a day (BID) | CUTANEOUS | Status: DC
  Administered 2014-12-08 – 2014-12-11 (×6): 1 via NASAL
  Filled 2014-12-08: qty 22

## 2014-12-08 MED ORDER — CISATRACURIUM BOLUS VIA INFUSION
0.0500 mg/kg | INTRAVENOUS | Status: DC | PRN
  Filled 2014-12-08: qty 4

## 2014-12-08 MED ORDER — MIDAZOLAM BOLUS VIA INFUSION
1.0000 mg | INTRAVENOUS | Status: DC | PRN
  Filled 2014-12-08: qty 1

## 2014-12-08 MED ORDER — LIDOCAINE HCL (PF) 1 % IJ SOLN
INTRAMUSCULAR | Status: AC
  Filled 2014-12-08: qty 30

## 2014-12-08 MED ORDER — NOREPINEPHRINE BITARTRATE 1 MG/ML IV SOLN
0.0000 ug/min | INTRAVENOUS | Status: DC
  Administered 2014-12-09: 5 ug/min via INTRAVENOUS
  Administered 2014-12-09: 9 ug/min via INTRAVENOUS
  Administered 2014-12-09: 6 ug/min via INTRAVENOUS
  Administered 2014-12-10: 18 ug/min via INTRAVENOUS
  Filled 2014-12-08 (×4): qty 4

## 2014-12-08 NOTE — Progress Notes (Signed)
Pt's sister-in-law D. Kary Kos is with pt's wife in [place]. Family is asking that information be routed there.   Pt's wife stated they have been together 3 years. Pt had previous marriage of 79 years to a wife who passed away. Pt has been under much stress lately and very concerned about his present wife's condition.   Wife also noted that pt takes heart medicine daily.   Delford Field, Chaplain 12/10/2014

## 2014-12-08 NOTE — Progress Notes (Signed)
Pt's wife is a patient in [place]. Mr. Alvizo' sister-in-law, Shawna Orleans is in consultation room B awaiting a word from physicians. She mentioned they were on the road when she noticed Mr. Cumbie began "not looking good".   [name], Chaplain 11/30/2014

## 2014-12-08 NOTE — Consult Note (Signed)
CARDIOLOGY CONSULT NOTE     Patient ID: [name] MRN: [id number] DOB/AGE: 1930/03/16 80 y.o.  Admit date: 11/30/2014 Referring Physician Orlie Dakin MD Primary Physician  Primary Cardiologist Casandra Doffing MD Reason for Consultation cardiac arrest   HPI:  80 yo WM presents to ED following witnessed cardiopulmonary arrest. He was riding in a car when he collapsed. He was unresponsive. Bystander then EMS CPR initiated. Down time estimated at 20 minutes. He had Vfib. Patient had an abnormal Myoview in July 2016 showing inferolateral scar without ischemia. EF 43%. He was treated medically. No known history of MI. Positive history of HTN and HL. No history of DM or tobacco abuse. He is now intubated and unresponsive.   Past Medical History  Diagnosis Date  . HTN (hypertension)   . Hyperlipidemia   . Carotid arterial disease   . CKD (chronic kidney disease) stage 3, GFR 30-59 ml/min     No family history on file.  History   Social History  . Marital Status: Married    Spouse Name: N/A    Number of Children: N/A  . Years of Education: N/A   Occupational History  . Not on file.   Social History Main Topics  . Smoking status: Never Smoker   . Smokeless tobacco: Not on file  . Alcohol Use: Not on file  . Drug Use: Not on file  . Sexual Activity: Not on file   Other Topics Concern  . Not on file   Social History Narrative  . No narrative on file    Past Surgical History  Procedure Laterality Date  . Prostate surgery    . Knee surgery       Medications: unknown at this time  ROS: Unable to obtain since patient unresponsive.  Physical Exam: Blood pressure 155/92, pulse 35, temperature 93.2 F (34 C), temperature source Core (Comment), resp. rate 14, height 6\' 2"  (1.88 m), weight 176 lb 5.9 oz (80 kg), SpO2 100 %.  Current Weight  12/17/2014 176 lb 5.9 oz (80 kg)    GENERAL:  Elderly WM intubated and unresponsive. HEENT:  Atraumatic NECK:  + jugular venous  distention, carotid upstroke brisk and symmetric, + bruits, no thyromegaly or adenopathy LUNGS:  Few rhonchi CHEST:  Unremarkable HEART:  RRR,  PMI not displaced or sustained,S1 and S2 within normal limits, no S3, no S4: no clicks, no rubs, no murmurs ABD:  Soft, nontender. BS +, no masses or bruits. Old midline surgical scar. EXT:  2 + pulses throughout, no edema, no cyanosis no clubbing SKIN:  Warm and dry.  No rashes NEURO:  Unresponsive. GCS 3.    Labs:   Lab Results  Component Value Date   WBC 27.2* 11/27/2014   HGB 11.4* 11/28/2014   HCT 35.5* 11/29/2014   MCV 94.7 12/06/2014   PLT 428* 11/29/2014     Recent Labs Lab 12/07/2014 1830  NA 138  K 3.9  CL 100  CO2 18*  BUN 30*  CREATININE 1.46*  CALCIUM 8.5  GLUCOSE 255*   Lab Results  Component Value Date   TROPONINI 13.79* 12/15/2014   No results found for: CHOL No results found for: HDL No results found for: LDLCALC No results found for: TRIG No results found for: CHOLHDL No results found for: LDLDIRECT  No results found for: PROBNP No results found for: TSH No results found for: HGBA1C  Radiology: Dg Chest Port 1 View  12/19/2014   CLINICAL DATA:  Respiratory failure.  Intubated.  EXAM: PORTABLE CHEST - 1 VIEW  COMPARISON:  Portable film earlier today.  FINDINGS: Endotracheal tube has been advanced and now lies 16 mm above the carina. The heart remains mildly enlarged. No focal lung opacities. RIGHT IJ catheter tip cavoatrial junction. No pneumothorax.  IMPRESSION: Satisfactory ETT placement.  No active cardiopulmonary disease.   Electronically Signed   By: Rolla Flatten M.D.   On: 11/24/2014 19:37   Dg Chest Portable 1 View  12/17/2014   CLINICAL DATA:  Difficult intubation  EXAM: PORTABLE CHEST - 1 VIEW  COMPARISON:  12/09/2014  FINDINGS: Endotracheal tube at the thoracic inlet.  Cardiomegaly with pulmonary vascular congestion. Increased interstitial markings, favored to be chronic. Mild interstitial edema  is possible. No pleural effusion or pneumothorax.  Right IJ venous catheter terminates in the lower SVC.  IMPRESSION: Endotracheal tube at the thoracic inlet.  Right IJ venous catheter terminates in the lower SVC. No pneumothorax.   Electronically Signed   By: Julian Hy M.D.   On: 12/15/2014 17:41   Dg Chest Portable 1 View  12/15/2014   CLINICAL DATA:  Cardiac arrest.  EXAM: PORTABLE CHEST - 1 VIEW  COMPARISON:  None.  FINDINGS: Cardiomegaly. Mild vascular congestion. Telemetry leads overlie the chest. Prior cervical fusion. No effusion or pneumothorax.  IMPRESSION: Cardiomegaly with mild vascular congestion.   Electronically Signed   By: Rolla Flatten M.D.   On: 12/02/2014 17:17   Dg Abd Portable 1v  12/10/2014   CLINICAL DATA:  Nasogastric tube placement  EXAM: PORTABLE ABDOMEN - 1 VIEW  COMPARISON:  None.  FINDINGS: Nasogastric tube tip lies in the stomach. Visualized bowel gas unremarkable.  IMPRESSION: NG tube tip in the stomach.   Electronically Signed   By: Rolla Flatten M.D.   On: 11/21/2014 19:38    EKG: Atrial fibrillation with wide complex RBBB. Diffuse ST depression   ASSESSMENT AND PLAN:  1. Out of hospital cardiopulmonary arrest with Vfib. This may be an acute ischemic event although no ST elevation on Ecg. Possible primary arrhythmia with old scar noted on prior Myoview. Will cycle cardiac enzymes and Ecg. Plan emergent cardiac cath to define coronary anatomy/risk. If acute lesion consider PCI. Check Echo to assess LV function. Patient given ASA pr pending angiography. 2. Acute respiratory failure post arrest. Intubated per CCM. 3. Hypoxic encephalopathy. Hypothermia protocol. 4. Hypokalemia. 5. Anemia 6. CKD stage 3.    Signed: Peter Martinique, [place]  11/26/2014, 9:12 PM

## 2014-12-08 NOTE — Code Documentation (Signed)
Patient remains NSR; intubation in process. CCR at beside.

## 2014-12-08 NOTE — Procedures (Signed)
Intubation Procedure Note [name] [id number] 1930/12/20  Procedure: Intubation Indications: Respiratory insufficiency  Procedure Details Consent: Unable to obtain consent because of emergent medical necessity. Time Out: Verified patient identification, verified procedure, site/side was marked, verified correct patient position, special equipment/implants available, medications/allergies/relevent history reviewed, required imaging and test results available.  Performed  Maximum sterile technique was used including antiseptics, cap, gloves, gown, hand hygiene, mask and sheet.  MAC and 3 Glidescope    Evaluation Hemodynamic Status: BP stable throughout; O2 sats: stable throughout Patient's Current Condition: stable Complications: No apparent complications Patient did tolerate procedure well. Chest X-ray ordered to verify placement.  CXR: pending.   Shellby Schlink S. 12/07/2014

## 2014-12-08 NOTE — Progress Notes (Signed)
Pt received from cath lab unresponsive. Order clarified re 33 degree or 36 degree hypothermia. 33 degree confirmed. Pads placed, fent ,versed and nimbex started.  IH machine alarmed for air leakage multiple times, pads checked but attempts futile. Machine changed but received same alarm with new machine. Finally new pads obtained from ED.   New pads placed and machine is working well. Dr. Stevenson Clinch informed while rounding via [place]. Updates and education given to pt's family. Will monitor closely.

## 2014-12-08 NOTE — Progress Notes (Signed)
RT spoke with MD in box on phone. MD had requested tube to be pulled back 1 cm. RT responded and noticed tube was already pulled back from earlier position. Tube is currently at 24@lip . MD is aware and ok with tube at 24.

## 2014-12-08 NOTE — Code Documentation (Signed)
Pt back into V-tach.  Shocked at 300.

## 2014-12-08 NOTE — ED Notes (Signed)
RT paged for transport of patient to cardiac cath lab.

## 2014-12-08 NOTE — CV Procedure (Signed)
    Cardiac Catheterization Procedure Note  Name: [name] MRN: [id number] DOB: 11-Mar-1930  Procedure: Left Heart Cath, Selective Coronary Angiography,   Indication: 80 yo WM with out of hospital cardiac arrest with Vfib. S/p successful resuscitation. Time to cath lab delayed due to need to stabilize patient in ED with intubation and placement of central line.    Procedural Details: The right wrist was prepped, draped, and anesthetized with 1% lidocaine. Using the modified Seldinger technique, a 6 French slender sheath was introduced into the right radial artery. 3 mg of verapamil was administered through the sheath, weight-based unfractionated heparin was administered intravenously. Standard Judkins catheters were used for selective coronary angiography and left ventricular pressure measurement. Catheter exchanges were performed over an exchange length guidewire. There were no immediate procedural complications. A TR band was used for radial hemostasis at the completion of the procedure.  The patient was transferred to the post catheterization recovery area for further monitoring.  Procedural Findings: Hemodynamics: AO 136/43 mean 76 mm Hg LV 132/20 mm Hg  Coronary angiography: Coronary dominance: right  Left mainstem: Normal.  Left anterior descending (LAD): The LAD is moderately calcified in the proximal vessel. There is 40-50% disease in the LAD after the take off of the first diagonal. The first diagonal has a 50% stenosis proximally.   Left circumflex (LCx): The LCx gives off 2 OM branches. There is a 40% stenosis in the mid LCx.  Right coronary artery (RCA): This is a very large vessel and dominant. There are mild irregularities less than 10-20%  Left ventriculography: not done  Final Conclusions:   1. Nonobstructive CAD  Recommendations: Continue support with CCM. Will order Echo to assess LV function.   Peter Martinique, [place]  11/25/2014, 5:49 PM

## 2014-12-08 NOTE — Code Documentation (Addendum)
Respiratory called and alerted of removal of king airway and intubation.

## 2014-12-08 NOTE — Procedures (Signed)
Central Venous Catheter Insertion Procedure Note [name] [id number] 1930/07/10  Procedure: Insertion of Central Venous Catheter Indications: Assessment of intravascular volume and Drug and/or fluid administration  Procedure Details Consent: Unable to obtain consent because of emergent medical necessity. Time Out: Verified patient identification, verified procedure, site/side was marked, verified correct patient position, special equipment/implants available, medications/allergies/relevent history reviewed, required imaging and test results available.  Performed  Maximum sterile technique was used including antiseptics, cap, gloves, gown, hand hygiene, mask and sheet. Skin prep: Chlorhexidine; local anesthetic administered A antimicrobial bonded/coated triple lumen catheter was placed in the right internal jugular vein using the Seldinger technique.  Evaluation Blood flow good Complications: No apparent complications Patient did tolerate procedure well. Chest X-ray ordered to verify placement.  CXR: normal.  [name] Tatem S. 12/07/2014, 6:31 PM

## 2014-12-08 NOTE — Progress Notes (Signed)
ANTIBIOTIC CONSULT NOTE - INITIAL  Pharmacy Consult for unasyn Indication: possible aspiration pneumonia  No Known Allergies  Patient Measurements: Height: 6\' 2"  (188 cm) Weight: 176 lb 5.9 oz (80 kg) IBW/kg (Calculated) : 82.2  Vital Signs: BP: 132/71 mmHg (12/19 1703) Pulse Rate: 95 (12/19 1630) Intake/Output from previous day:   Intake/Output from this shift:    Labs:  Recent Labs  12/20/2014 1619 11/23/2014 1627  WBC 9.5  --   HGB 10.5* 12.2*  PLT 294  --   CREATININE 1.34 1.40*   Estimated Creatinine Clearance: 46 mL/min (by C-G formula based on Cr of 1.4). No results for input(s): VANCOTROUGH, VANCOPEAK, VANCORANDOM, GENTTROUGH, GENTPEAK, GENTRANDOM, TOBRATROUGH, TOBRAPEAK, TOBRARND, AMIKACINPEAK, AMIKACINTROU, AMIKACIN in the last 72 hours.   Microbiology: No results found for this or any previous visit (from the past 720 hour(s)).  Medical History: No past medical history on file.  Assessment: 80 year old male s/p cardiac arrest, called for code stemi/cool. Now s/p cath (clean), new orders to start unasyn empirically for possible aspiration. Hypothermia protocol, wbc 9.5. Renal function slightly impaired with scr of 1.4. No dose adjustment needed.  Goal of Therapy:  Resolution of infection  Plan:  Unasyn 3g IV q8 hours Follow up s/s of infection/aspiration  Erin Hearing PharmD., BCPS Clinical Pharmacist Pager [phone] 11/28/2014 6:01 PM

## 2014-12-08 NOTE — H&P (Signed)
PULMONARY / CRITICAL CARE MEDICINE   Name: [name] MRN: [id number] DOB: 1930/02/13    ADMISSION DATE:  12/17/2014 CONSULTATION DATE:  12/19  REFERRING MD :  Leanne Lovely   CHIEF COMPLAINT:  Cardiopulmonary arrest   INITIAL PRESENTATION:  This is a 80 year old male. Only known hx on admit was prior cardiac hx w/ EF in 50s by My[facility]. Was visiting his wife who is a patient at [facility] on 12/19. Was riding in car on way home  when slumped over and became unresponsive. EMS response time unclear. On arrival was in VF. Underwent ACLS estimated at ~20 minutes to ROSC. PCCM asked to admit s/p arrest    STUDIES:  Cardiac cath 12/19>>>  SIGNIFICANT EVENTS: 12/19 cardiac arrest. Estimated down time ~ 20 mintues 12/19: cath lab>>> 12/19 hypothermia started.     HISTORY OF PRESENT ILLNESS:    PAST MEDICAL HISTORY :   has no past medical history on file.  has no past surgical history on file. Prior to Admission medications   Not on File   No Known Allergies  FAMILY HISTORY:  has no family status information on file.  SOCIAL HISTORY:    REVIEW OF SYSTEMS:  Unable   SUBJECTIVE:  Critically ill  VITAL SIGNS: Pulse Rate:  [92-139] 92 (12/19 1622) Resp:  [18-21] 18 (12/19 1703) BP: (90-132)/(60-95) 132/71 mmHg (12/19 1703) SpO2:  [87 %-95 %] 87 % (12/19 1703) HEMODYNAMICS:   VENTILATOR SETTINGS:   INTAKE / OUTPUT: No intake or output data in the 24 hours ending 12/03/2014 1709  PHYSICAL EXAMINATION: General:  Elderly 80 year old male, unresponsive on full vent support  Neuro:  Gcs 3, No spont movement.  HEENT:  Edentulous, + JVD, now orally intubated   Cardiovascular:  Reg, no MRG  Lungs:  Scattered rhonchi  Abdomen:  Soft, non-tender, + bowel sounds, old mid abd scar healed  Musculoskeletal:  Intact  Skin:  Cool and mottled   LABS:  CBC  Recent Labs Lab 12/10/2014 1619 12/13/2014 1627  WBC 9.5  --   HGB 10.5* 12.2*  HCT 33.6* 36.0*  PLT 294  --     Coag's No results for input(s): APTT, INR in the last 168 hours. BMET  Recent Labs Lab 12/16/2014 1619 12/17/2014 1627  NA 136* 138  K 3.4* 3.6*  CL 99 105  CO2 17*  --   BUN 25* 32*  CREATININE 1.34 1.40*  GLUCOSE 285* 264*   Electrolytes  Recent Labs Lab 12/10/2014 1619  CALCIUM 8.8   Sepsis Markers No results for input(s): LATICACIDVEN, PROCALCITON, O2SATVEN in the last 168 hours. ABG No results for input(s): PHART, PCO2ART, PO2ART in the last 168 hours. Liver Enzymes No results for input(s): AST, ALT, ALKPHOS, BILITOT, ALBUMIN in the last 168 hours. Cardiac Enzymes No results for input(s): TROPONINI, PROBNP in the last 168 hours. Glucose No results for input(s): GLUCAP in the last 168 hours.  Imaging No results found.   ASSESSMENT / PLAN:  PULMONARY OETT 12/19>>> A: acute respiratory failure s/p cardiopulmonary arrest.  Pulmonary infiltrates-->improved w/ positive pressure  P:   Full vent support F/u abg F/u CXR Hypothermia protocol   CARDIOVASCULAR CVL right IJ CVL 12/19>>> A:  VT arrest Cardiogenic shock P:    RENAL A:   AKI w/ + anion gap metabolic acidosis s/p cardiac arrest  P:   IV hydration Renal dose meds Strict I&O F/u chem  GASTROINTESTINAL A:  Prior surgical hx/ unknown P:   SUP  w/ PPI  HEMATOLOGIC A:  Anemia of unclear chronicity   P:  Trend cbc Defer heparin to cards   INFECTIOUS A:  R/o aspiration  P:   Sputum 12/19>> Abx: Unasyn 12/19>>>  ENDOCRINE A:  Hyperglycemia  P:   ssi   NEUROLOGIC A:   Acute encephalopathy  Worried about HIE. Down-time ~20 minutes  P:   RASS goal: -4 Hypothermia protocol: will get NMB and fentanyl gtt    FAMILY  - Updates: pending   - Inter-disciplinary family meet or Palliative Care meeting due by:  12/27    TODAY'S SUMMARY: going st to cath lab s/p VF arrest. Meets hypothermia protocol. Will begin this. Cards at bedside.   Erick Colace ACNP-BC [facility] Pager # [phone] OR # [phone] if no answer     Attending Note:  I have examined patient, reviewed labs, studies and notes. I have discussed the case with Jerrye Bushy and I agree with the data and plans as amended above. He arrested in the car, CPR was approximately 25 minutes total. Hypothermia was initiated. I discussed his case with Drs Mare Ferrari and [place] and he was taken to the cath lab. We will target 33C. All supportive care initiated.  Will assess MS after rewarming completed.   Baltazar Apo, MD, PhD 12/03/2014, 6:26 PM [facility] [phone] or if no answer [phone]

## 2014-12-08 NOTE — Code Documentation (Signed)
Cardiology at bedside.

## 2014-12-08 NOTE — ED Provider Notes (Signed)
CSN: [id number]     Arrival date & time 12/01/2014  1604 History   First MD Initiated Contact with Patient 11/24/2014 1624     Chief Complaint  Patient presents with  . Code STEMI  . code cold    Level V caveat cardiac arrest. History is obtained from EMS  (Consider location/radiation/quality/duration/timing/severity/associated sxs/prior Treatment) HPI Patient was riding in a car when he suddenly slumped over. He was treated with bystander CPR. EMS arrived to find patient in ventricular fibrillation, pulseless and apneic EMS treated patient with CPR via [facility] device, intubated patient orally, he also received cold saline 900 mL intravenously, amiodarone 450 mg via intraosseous line and epinephrine 4 mg via intraosseous line. Rhythm upon arrival was sinus. No past medical history on file. No past surgical history on file. past medical history unknown, surgical history unknown  No family history on file. History  Substance Use Topics  . Smoking status: Not on file  . Smokeless tobacco: Not on file  . Alcohol Use: Not on file   social history unknown   Review of Systems  Unable to perform ROS  heart arrest patient unresponsive     Allergies  Review of patient's allergies indicates not on file.  Home Medications   Prior to Admission medications   Not on File   BP 107/95 mmHg  Pulse 92  Resp 21  SpO2 95% Physical Exam  Constitutional: He appears well-developed and well-nourished.  HENT:  Head: Normocephalic and atraumatic.  Neck: Neck supple. No tracheal deviation present. No thyromegaly present.  Cardiovascular: Normal rate and intact distal pulses.   No murmur heard. Irregularly irregular. Femoral and carotid pulses present  Pulmonary/Chest: Effort normal and breath sounds normal.  Spontaneous respirations over ventilator  Abdominal: Soft. Bowel sounds are normal. He exhibits no distension. There is no tenderness.  Musculoskeletal: Normal range of motion. He exhibits no  edema or tenderness.  Neurological: Coordination normal.  Unresponsive Glasgow Coma Score 3  Skin: No rash noted.  Peripheral cyanosis  Nursing note and vitals reviewed.   ED Course  Procedures (including critical care time) Labs Review Labs Reviewed  CBC  DIFFERENTIAL  PROTIME-INR  APTT  BASIC METABOLIC PANEL  BLOOD GAS, ARTERIAL  URINALYSIS, ROUTINE W REFLEX MICROSCOPIC  I-STAT CHEM 8, ED  I-STAT TROPOININ, ED    Imaging Review No results found.   EKG Interpretation   Date/Time:  Saturday December 08 2014 16:22:59 EST Ventricular Rate:  91 PR Interval:    QRS Duration: 150 QT Interval:  430 QTC Calculation: 529 R Axis:   -96 Text Interpretation:  Atrial fibrillation Multiple ventricular premature  complexes Right bundle branch block Anterior infarct, old ST depr,  consider ischemia, inferior leads Confirmed by Harlin Mazzoni  MD, Abdelrahman Nair [phone])  on 11/20/2014 4:27:54 PM     He was cardioverted one time for suspected ventricular tachycardia with 300 J. Dr. Mare Ferrari from cardiology arrived to evaluate patient. Pulmonary critical care medicine also called and will consult on patient in ED Results for orders placed or performed during the hospital encounter of 12/03/2014  I-Stat Chem 8, ED  Result Value Ref Range   Sodium 138 137 - 147 mEq/L   Potassium 3.6 (L) 3.7 - 5.3 mEq/L   Chloride 105 96 - 112 mEq/L   BUN 32 (H) 6 - 23 mg/dL   Creatinine, Ser 1.40 (H) 0.50 - 1.35 mg/dL   Glucose, Bld 264 (H) 70 - 99 mg/dL   Calcium, Ion 1.17 1.13 - 1.30 mmol/L  TCO2 16 0 - 100 mmol/L   Hemoglobin 12.2 (L) 13.0 - 17.0 g/dL   HCT 36.0 (L) 39.0 - 52.0 %   No results found. PTS FAMMILY MEMBER NOTIFIED OF EVENT MDM   code STEMI called on patient's arrival due to V. fib arrest and high probability of STEMI. "Cold called by me as well upon patient's arrival. Patient's axillae and groin pack with ice.     Patient will go to cardiac catheterization laboratory  Diagnosis #1  cardiac arrest  #2 stemi #3 ventricular tachycardia  Final diagnoses:  Cardiac arrest   #3HYPERGLYCEMIA #4 RENAL INSUFFICIENCY CRITICAL CARE Performed by: Orlie Dakin Total critical care time: 45 MINUTE Critical care time was exclusive of separately billable procedures and treating other patients. Critical care was necessary to treat or prevent imminent or life-threatening deterioration. Critical care was time spent personally by me on the following activities: development of treatment plan with patient and/or surrogate as well as nursing, discussions with consultants, evaluation of patient's response to treatment, examination of patient, obtaining history from patient or surrogate, ordering and performing treatments and interventions, ordering and review of laboratory studies, ordering and review of radiographic studies, pulse oximetry and re-evaluation of patient's condition.    Orlie Dakin, MD 11/20/2014 1640

## 2014-12-09 ENCOUNTER — Inpatient Hospital Stay (HOSPITAL_COMMUNITY): Payer: Medicare Other

## 2014-12-09 DIAGNOSIS — J189 Pneumonia, unspecified organism: Secondary | ICD-10-CM | POA: Diagnosis present

## 2014-12-09 DIAGNOSIS — I4891 Unspecified atrial fibrillation: Secondary | ICD-10-CM | POA: Diagnosis present

## 2014-12-09 LAB — POCT I-STAT, CHEM 8
BUN: 37 mg/dL — AB (ref 6–23)
BUN: 34 mg/dL — AB (ref 6–23)
BUN: 34 mg/dL — AB (ref 6–23)
CALCIUM ION: 1.1 mmol/L — AB (ref 1.13–1.30)
CALCIUM ION: 1.1 mmol/L — AB (ref 1.13–1.30)
CHLORIDE: 110 meq/L (ref 96–112)
CHLORIDE: 118 meq/L — AB (ref 96–112)
CREATININE: 1.6 mg/dL — AB (ref 0.50–1.35)
Calcium, Ion: 1.18 mmol/L (ref 1.13–1.30)
Chloride: 116 mEq/L — ABNORMAL HIGH (ref 96–112)
Creatinine, Ser: 1.6 mg/dL — ABNORMAL HIGH (ref 0.50–1.35)
Creatinine, Ser: 1.6 mg/dL — ABNORMAL HIGH (ref 0.50–1.35)
GLUCOSE: 140 mg/dL — AB (ref 70–99)
GLUCOSE: 142 mg/dL — AB (ref 70–99)
Glucose, Bld: 134 mg/dL — ABNORMAL HIGH (ref 70–99)
HCT: 35 % — ABNORMAL LOW (ref 39.0–52.0)
HCT: 35 % — ABNORMAL LOW (ref 39.0–52.0)
HEMATOCRIT: 39 % (ref 39.0–52.0)
Hemoglobin: 13.3 g/dL (ref 13.0–17.0)
Hemoglobin: 11.9 g/dL — ABNORMAL LOW (ref 13.0–17.0)
Hemoglobin: 11.9 g/dL — ABNORMAL LOW (ref 13.0–17.0)
Potassium: 3.7 mEq/L (ref 3.7–5.3)
Potassium: 3.8 mEq/L (ref 3.7–5.3)
Potassium: 3.8 mEq/L (ref 3.7–5.3)
Sodium: 142 mEq/L (ref 137–147)
Sodium: 139 mEq/L (ref 137–147)
Sodium: 137 mEq/L (ref 137–147)
TCO2: 17 mmol/L (ref 0–100)
TCO2: 17 mmol/L (ref 0–100)
TCO2: 18 mmol/L (ref 0–100)

## 2014-12-09 LAB — BASIC METABOLIC PANEL
Anion gap: 15 (ref 5–15)
Anion gap: 16 — ABNORMAL HIGH (ref 5–15)
Anion gap: 18 — ABNORMAL HIGH (ref 5–15)
Anion gap: 18 — ABNORMAL HIGH (ref 5–15)
BUN: 34 mg/dL — ABNORMAL HIGH (ref 6–23)
BUN: 37 mg/dL — AB (ref 6–23)
BUN: 38 mg/dL — AB (ref 6–23)
BUN: 38 mg/dL — AB (ref 6–23)
CALCIUM: 8.5 mg/dL (ref 8.4–10.5)
CALCIUM: 8.4 mg/dL (ref 8.4–10.5)
CHLORIDE: 107 meq/L (ref 96–112)
CHLORIDE: 105 meq/L (ref 96–112)
CO2: 17 meq/L — AB (ref 19–32)
CO2: 18 mEq/L — ABNORMAL LOW (ref 19–32)
CO2: 18 mEq/L — ABNORMAL LOW (ref 19–32)
CO2: 20 meq/L (ref 19–32)
CREATININE: 1.47 mg/dL — AB (ref 0.50–1.35)
CREATININE: 1.48 mg/dL — AB (ref 0.50–1.35)
CREATININE: 1.51 mg/dL — AB (ref 0.50–1.35)
CREATININE: 1.51 mg/dL — AB (ref 0.50–1.35)
Calcium: 8.3 mg/dL — ABNORMAL LOW (ref 8.4–10.5)
Calcium: 8.8 mg/dL (ref 8.4–10.5)
Chloride: 102 mEq/L (ref 96–112)
Chloride: 105 mEq/L (ref 96–112)
GFR calc Af Amer: 48 mL/min — ABNORMAL LOW (ref >90)
GFR calc Af Amer: 49 mL/min — ABNORMAL LOW (ref >90)
GFR calc Af Amer: 49 mL/min — ABNORMAL LOW (ref >90)
GFR calc non Af Amer: 41 mL/min — ABNORMAL LOW (ref >90)
GFR calc non Af Amer: 43 mL/min — ABNORMAL LOW (ref >90)
GFR calc non Af Amer: 41 mL/min — ABNORMAL LOW (ref >90)
GFR, EST AFRICAN AMERICAN: 48 mL/min — AB (ref >90)
GFR, EST NON AFRICAN AMERICAN: 42 mL/min — AB (ref >90)
GLUCOSE: 135 mg/dL — AB (ref 70–99)
GLUCOSE: 173 mg/dL — AB (ref 70–99)
GLUCOSE: 228 mg/dL — AB (ref 70–99)
Glucose, Bld: 138 mg/dL — ABNORMAL HIGH (ref 70–99)
Potassium: 3.5 mEq/L — ABNORMAL LOW (ref 3.7–5.3)
Potassium: 3.6 mEq/L — ABNORMAL LOW (ref 3.7–5.3)
Potassium: 3.9 mEq/L (ref 3.7–5.3)
Potassium: 4.1 mEq/L (ref 3.7–5.3)
SODIUM: 138 meq/L (ref 137–147)
Sodium: 141 mEq/L (ref 137–147)
Sodium: 142 mEq/L (ref 137–147)
Sodium: 138 mEq/L (ref 137–147)

## 2014-12-09 LAB — GLUCOSE, CAPILLARY
GLUCOSE-CAPILLARY: 164 mg/dL — AB (ref 70–99)
GLUCOSE-CAPILLARY: 142 mg/dL — AB (ref 70–99)
GLUCOSE-CAPILLARY: 128 mg/dL — AB (ref 70–99)
GLUCOSE-CAPILLARY: 120 mg/dL — AB (ref 70–99)
GLUCOSE-CAPILLARY: 117 mg/dL — AB (ref 70–99)
Glucose-Capillary: 186 mg/dL — ABNORMAL HIGH (ref 70–99)
Glucose-Capillary: 202 mg/dL — ABNORMAL HIGH (ref 70–99)
Glucose-Capillary: 115 mg/dL — ABNORMAL HIGH (ref 70–99)
Glucose-Capillary: 113 mg/dL — ABNORMAL HIGH (ref 70–99)
Glucose-Capillary: 120 mg/dL — ABNORMAL HIGH (ref 70–99)
Glucose-Capillary: 127 mg/dL — ABNORMAL HIGH (ref 70–99)

## 2014-12-09 LAB — URINALYSIS, ROUTINE W REFLEX MICROSCOPIC
BILIRUBIN URINE: NEGATIVE
Glucose, UA: NEGATIVE mg/dL
KETONES UR: NEGATIVE mg/dL
Nitrite: NEGATIVE
PH: 5 (ref 5.0–8.0)
Protein, ur: 100 mg/dL — AB
Specific Gravity, Urine: 1.028 (ref 1.005–1.030)
Urobilinogen, UA: 1 mg/dL (ref 0.0–1.0)

## 2014-12-09 LAB — POCT I-STAT 3, ART BLOOD GAS (G3+)
ACID-BASE DEFICIT: 6 mmol/L — AB (ref 0.0–2.0)
BICARBONATE: 20.9 meq/L (ref 20.0–24.0)
O2 Saturation: 100 %
PH ART: 7.32 — AB (ref 7.350–7.450)
TCO2: 22 mmol/L (ref 0–100)
pCO2 arterial: 38.2 mmHg (ref 35.0–45.0)
pO2, Arterial: 335 mmHg — ABNORMAL HIGH (ref 80.0–100.0)

## 2014-12-09 LAB — HEPARIN LEVEL (UNFRACTIONATED): HEPARIN UNFRACTIONATED: 0.12 [IU]/mL — AB (ref 0.30–0.70)

## 2014-12-09 LAB — URINE MICROSCOPIC-ADD ON

## 2014-12-09 LAB — TROPONIN I
Troponin I: >20 ng/mL (ref <0.30)
Troponin I: >20 ng/mL (ref <0.30)

## 2014-12-09 LAB — OCCULT BLOOD X 1 CARD TO LAB, STOOL: Fecal Occult Bld: NEGATIVE

## 2014-12-09 LAB — CLOSTRIDIUM DIFFICILE BY PCR: Toxigenic C. Difficile by PCR: NEGATIVE

## 2014-12-09 LAB — PROTIME-INR
INR: 1.14 (ref 0.00–1.49)
Prothrombin Time: 14.8 seconds (ref 11.6–15.2)

## 2014-12-09 LAB — APTT: APTT: 35 s (ref 24–37)

## 2014-12-09 MED ORDER — SODIUM CHLORIDE 0.9 % IV SOLN
INTRAVENOUS | Status: DC | PRN
  Administered 2014-12-09: 500 mL via INTRAVENOUS

## 2014-12-09 MED ORDER — SODIUM CHLORIDE 0.9 % IJ SOLN: 10.0000 mL | INTRAMUSCULAR | Status: DC | PRN

## 2014-12-09 MED ORDER — HEPARIN (PORCINE) IN NACL 100-0.45 UNIT/ML-% IJ SOLN
900.0000 [IU]/h | INTRAMUSCULAR | Status: DC
  Administered 2014-12-09: 700 [IU]/h via INTRAVENOUS
  Administered 2014-12-10: 900 [IU]/h via INTRAVENOUS
  Filled 2014-12-09 (×4): qty 250

## 2014-12-09 MED ORDER — HEPARIN BOLUS VIA INFUSION
2000.0000 [IU] | Freq: Once | INTRAVENOUS | Status: AC
  Administered 2014-12-09: 2000 [IU] via INTRAVENOUS
  Filled 2014-12-09: qty 2000

## 2014-12-09 MED ORDER — SODIUM CHLORIDE 0.9 % IJ SOLN
10.0000 mL | Freq: Two times a day (BID) | INTRAMUSCULAR | Status: DC
  Administered 2014-12-09 – 2014-12-11 (×5): 10 mL

## 2014-12-09 NOTE — Progress Notes (Signed)
Wasted 25 ml of fentanyl in sink. Witnessed times two RN's. Delbert Phenix RN and Ollen Barges RN

## 2014-12-09 NOTE — Progress Notes (Signed)
Wasted 60 ml of Nimbex in sink. Witnessed times two RN's. Delbert Phenix RN and Ollen Barges RN

## 2014-12-09 NOTE — Progress Notes (Addendum)
ANTICOAGULATION CONSULT NOTE - Initial Consult  Pharmacy Consult for Heparin Indication: atrial fibrillation  No Known Allergies  Patient Measurements: Height: 6\' 2"  (188 cm) Weight: 176 lb 5.9 oz (80 kg) IBW/kg (Calculated) : 82.2  Vital Signs: Temp: 91.9 F (33.3 C) (12/20 0800) Temp Source: Core (Comment) (12/20 0800) BP: 109/72 mmHg (12/20 0803) Pulse Rate: 45 (12/20 0900)  Labs:  Recent Labs  12/19/2014 1619 11/20/2014 1627 12/06/2014 1745  11/29/2014 2118 12/17/2014 2145 12/16/2014 2354 12/09/14 0145 12/09/14 0545  HGB 10.5* 12.2*  --   --   --  11.4*  --   --   --   HCT 33.6* 36.0*  --   --   --  35.5*  --   --   --   PLT 294  --   --   --   --  428*  --   --   --   APTT 40*  --  128*  --   --   --   --  35  --   LABPROT 16.5*  --  16.1*  --   --   --   --  14.8  --   INR 1.32  --  1.28  --   --   --   --  1.14  --   CREATININE 1.34 1.40* 1.46*  < > 1.49*  --  1.48* 1.51* 1.47*  TROPONINI  --   --  13.79*  --  >20.00*  --   --   --  >20.00*  < > = values in this interval not displayed.  Estimated Creatinine Clearance: 43.8 mL/min (by C-G formula based on Cr of 1.47).   Medical History: Past Medical History  Diagnosis Date  . HTN (hypertension)   . Hyperlipidemia   . Carotid arterial disease   . CKD (chronic kidney disease) stage 3, GFR 30-59 ml/min     Medications:  Scheduled:  . ampicillin-sulbactam (UNASYN) IV  3 g Intravenous Q8H  . antiseptic oral rinse  7 mL Mouth Rinse QID  . artificial tears  1 application Both Eyes 3 times per day  . chlorhexidine  15 mL Mouth Rinse BID  . Chlorhexidine Gluconate Cloth  6 each Topical Q0600  . cisatracurium  0.1 mg/kg Intravenous Once  . fentaNYL  50 mcg Intravenous Once  . insulin aspart  0-15 Units Subcutaneous 6 times per day  . mupirocin ointment  1 application Nasal BID  . pantoprazole (PROTONIX) IV  40 mg Intravenous QHS  . sodium chloride  10-40 mL Intracatheter Q12H  . sodium chloride  3 mL Intravenous Q12H    Infusions:  . cisatracurium (NIMBEX) infusion 1 mcg/kg/min (12/11/2014 1839)  . fentaNYL infusion INTRAVENOUS 100 mcg/hr (11/22/2014 2200)  . heparin    . midazolam (VERSED) infusion 4 mg/hr (12/09/14 0316)  . norepinephrine (LEVOPHED) Adult infusion 10 mcg/min (12/09/14 0745)    Assessment: 80 yo M admitted on 12/10/2014 post cardiac arrest currently on hypothermia protocol. Patient noted to have new onset afib overnight so heparin gtt will be initiated. BL coag panel wnl, H/H slightly low, plt wnl with no reported s/s bleeding.   Goal of Therapy:  Heparin level 0.3-0.7 units/ml Monitor platelets by anticoagulation protocol: Yes   Plan:  - Begin heparin without bolus (recently received SQ) at 700 units/hr (~9 units/kg/hr) - HL in 6 hours - Monitor CBC and for s/s bleeding - F/u rewarming  Erika K. Velva Harman, PharmD Clinical Pharmacist - Resident Pager: [phone] Pharmacy:  [phone] 12/09/2014 10:03 AM  Addendum:  Heparin level low this evening at 0.12. No bleeding issues noted, no IV issues noted.  Still in afib today/currently.   Bolus 2000 units now then increase infusion rate to 900 units/hr. Recheck HL in 8 hours  Erin Hearing PharmD., BCPS Clinical Pharmacist Pager [phone] 12/09/2014 8:00 PM

## 2014-12-09 NOTE — Progress Notes (Signed)
DAILY PROGRESS NOTE  Subjective:  Out of hospital arrest, now on arctic sun. Cardiac cath yesterday shows non-obstructive CAD. LV function not assessed. Loose stools overnight - on c. Difficile precautions. New onset a-fib overnight - also having NSVT.  Objective:  Temp:  [89.6 F (32 C)-94.5 F (34.7 C)] 91.9 F (33.3 C) (12/20 0800) Pulse Rate:  [30-139] 45 (12/20 0900) Resp:  [10-22] 14 (12/20 0900) BP: (90-164)/(37-130) 109/72 mmHg (12/20 0803) SpO2:  [87 %-100 %] 100 % (12/20 0900) Arterial Line BP: (88-140)/(55-95) 122/71 mmHg (12/20 0900) FiO2 (%):  [50 %-100 %] 50 % (12/20 0803) Weight:  [176 lb 5.9 oz (80 kg)] 176 lb 5.9 oz (80 kg) (12/19 1703) Weight change:   Intake/Output from previous day: 12/19 0701 - 12/20 0700 In: 985 [I.V.:585; IV Piggyback:400] Out: 690 [Urine:390; Emesis/NG output:300]  Intake/Output from this shift: Total I/O In: 67.9 [I.V.:67.9] Out: 40 [Urine:40]  Medications: Current Facility-Administered Medications  Medication Dose Route Frequency Provider Last Rate Last Dose  . 0.9 %  sodium chloride infusion  2,000 mL Intravenous Once Peter M Martinique, MD      . 0.9 %  sodium chloride infusion  2,000 mL Intravenous Once Erick Colace, NP      . 0.9 %  sodium chloride infusion  250 mL Intravenous PRN Peter M Martinique, MD 20 mL/hr at 12/09/2014 1900 250 mL at 12/11/2014 1900  . 0.9 %  sodium chloride infusion   Intravenous PRN Peter M Martinique, MD 10 mL/hr at 12/09/14 0833 500 mL at 12/09/14 0833  . Ampicillin-Sulbactam (UNASYN) 3 g in sodium chloride 0.9 % 100 mL IVPB  3 g Intravenous Q8H Georgina Peer, RPH   3 g at 12/09/14 0350  . antiseptic oral rinse (CPC / CETYLPYRIDINIUM CHLORIDE 0.05%) solution 7 mL  7 mL Mouth Rinse QID Erick Colace, NP   7 mL at 12/09/14 0400  . artificial tears (LACRILUBE) ophthalmic ointment 1 application  1 application Both Eyes 3 times per day Erick Colace, NP   1 application at 03/70/48 0543  . chlorhexidine  (PERIDEX) 0.12 % solution 15 mL  15 mL Mouth Rinse BID Erick Colace, NP   15 mL at 12/09/14 0740  . Chlorhexidine Gluconate Cloth 2 % PADS 6 each  6 each Topical Q0600 Peter M Martinique, MD   6 each at 12/09/14 0542  . cisatracurium (NIMBEX) bolus via infusion 8 mg  0.1 mg/kg Intravenous Once Erick Colace, NP       And  . cisatracurium (NIMBEX) 200 mg in sodium chloride 0.9 % 200 mL (1 mg/mL) infusion  1-1.5 mcg/kg/min Intravenous Continuous Erick Colace, NP 4.8 mL/hr at 11/20/2014 1839 1 mcg/kg/min at 11/24/2014 1839   And  . cisatracurium (NIMBEX) bolus via infusion 4 mg  0.05 mg/kg Intravenous PRN Erick Colace, NP      . fentaNYL (SUBLIMAZE) 2,500 mcg in sodium chloride 0.9 % 250 mL (10 mcg/mL) infusion  25-400 mcg/hr Intravenous Continuous Erick Colace, NP 10 mL/hr at 11/28/2014 2200 100 mcg/hr at 12/17/2014 2200  . fentaNYL (SUBLIMAZE) bolus via infusion 25 mcg  25 mcg Intravenous Q30 min PRN Erick Colace, NP      . fentaNYL (SUBLIMAZE) injection 50 mcg  50 mcg Intravenous Once Erick Colace, NP      . heparin injection 5,000 Units  5,000 Units Subcutaneous 3 times per day Erick Colace, NP   5,000 Units at 12/09/14 0542  .  insulin aspart (novoLOG) injection 0-15 Units  0-15 Units Subcutaneous 6 times per day Erick Colace, NP   2 Units at 12/09/14 0348  . midazolam (VERSED) 50 mg in sodium chloride 0.9 % 50 mL (1 mg/mL) infusion  1-10 mg/hr Intravenous Continuous Erick Colace, NP 4 mL/hr at 12/09/14 0316 4 mg/hr at 12/09/14 0316  . midazolam (VERSED) bolus via infusion 1 mg  1 mg Intravenous Q30 min PRN Erick Colace, NP      . midazolam (VERSED) injection 1 mg  1 mg Intravenous Once Erick Colace, NP      . mupirocin ointment (BACTROBAN) 2 % 1 application  1 application Nasal BID Peter M Martinique, MD   1 application at 16/07/37 2349  . norepinephrine (LEVOPHED) 4 mg in dextrose 5 % 250 mL (0.016 mg/mL) infusion  0-50 mcg/min Intravenous Titrated Erick Colace, NP 37.5 mL/hr  at 12/09/14 0745 10 mcg/min at 12/09/14 0745  . pantoprazole (PROTONIX) injection 40 mg  40 mg Intravenous QHS Erick Colace, NP   40 mg at 11/22/2014 2239  . sodium chloride 0.9 % injection 10-40 mL  10-40 mL Intracatheter Q12H Pixie Casino, MD      . sodium chloride 0.9 % injection 10-40 mL  10-40 mL Intracatheter PRN Pixie Casino, MD      . sodium chloride 0.9 % injection 3 mL  3 mL Intravenous Q12H Peter M Martinique, MD   3 mL at 12/09/14 0212  . sodium chloride 0.9 % injection 3 mL  3 mL Intravenous PRN Peter M Martinique, MD        Physical Exam: General appearance: Sedated, paralyzed on arctic sun, ventilator Neck: no carotid bruit and no JVD Lungs: diminished breath sounds bilaterally Heart: irregularly irregular rhythm and tachycardia Abdomen: soft, non-distended, hypoactive bowel sounds Extremities: extremities normal, atraumatic, no cyanosis or edema Pulses: 2+ and symmetric Skin: pale, cool Neurologic: Mental status: sedated, intubated on vent .  Lab Results: Results for orders placed or performed during the hospital encounter of 12/09/2014 (from the past 48 hour(s))  CBC     Status: Abnormal   Collection Time: 12/19/2014  4:19 PM  Result Value Ref Range   WBC 9.5 4.0 - 10.5 K/uL   RBC 3.55 (L) 4.22 - 5.81 MIL/uL   Hemoglobin 10.5 (L) 13.0 - 17.0 g/dL   HCT 33.6 (L) 39.0 - 52.0 %   MCV 94.6 78.0 - 100.0 fL   MCH 29.6 26.0 - 34.0 pg   MCHC 31.3 30.0 - 36.0 g/dL   RDW 17.0 (H) 11.5 - 15.5 %   Platelets 294 150 - 400 K/uL  Differential     Status: None   Collection Time: 11/20/2014  4:19 PM  Result Value Ref Range   Neutrophils Relative % 50 43 - 77 %   Lymphocytes Relative 39 12 - 46 %   Monocytes Relative 7 3 - 12 %   Eosinophils Relative 3 0 - 5 %   Basophils Relative 1 0 - 1 %   Neutro Abs 4.7 1.7 - 7.7 K/uL   Lymphs Abs 3.7 0.7 - 4.0 K/uL   Monocytes Absolute 0.7 0.1 - 1.0 K/uL   Eosinophils Absolute 0.3 0.0 - 0.7 K/uL   Basophils Absolute 0.1 0.0 - 0.1 K/uL    RBC Morphology RARE NRBCs    WBC Morphology INCREASED BANDS (>20% BANDS)   Protime-INR     Status: Abnormal   Collection Time: 12/10/2014  4:19 PM  Result Value Ref Range   Prothrombin Time 16.5 (H) 11.6 - 15.2 seconds   INR 1.32 0.00 - 1.49  APTT     Status: Abnormal   Collection Time: 11/30/2014  4:19 PM  Result Value Ref Range   aPTT 40 (H) 24 - 37 seconds    Comment:        IF BASELINE aPTT IS ELEVATED, SUGGEST PATIENT RISK ASSESSMENT BE USED TO DETERMINE APPROPRIATE ANTICOAGULANT THERAPY.   Basic metabolic panel     Status: Abnormal   Collection Time: 12/12/2014  4:19 PM  Result Value Ref Range   Sodium 136 (L) 137 - 147 mEq/L   Potassium 3.4 (L) 3.7 - 5.3 mEq/L   Chloride 99 96 - 112 mEq/L   CO2 17 (L) 19 - 32 mEq/L   Glucose, Bld 285 (H) 70 - 99 mg/dL   BUN 25 (H) 6 - 23 mg/dL   Creatinine, Ser 1.34 0.50 - 1.35 mg/dL   Calcium 8.8 8.4 - 10.5 mg/dL   GFR calc non Af Amer 48 (L) >90 mL/min   GFR calc Af Amer 55 (L) >90 mL/min    Comment: (NOTE) The eGFR has been calculated using the CKD EPI equation. This calculation has not been validated in all clinical situations. eGFR's persistently <90 mL/min signify possible Chronic Kidney Disease.    Anion gap 20 (H) 5 - 15  I-stat troponin, ED (do not order at [facility])     Status: Abnormal   Collection Time: 12/14/2014  4:26 PM  Result Value Ref Range   Troponin i, poc 0.15 (HH) 0.00 - 0.08 ng/mL   Comment NOTIFIED PHYSICIAN    Comment 3            Comment: Due to the release kinetics of cTnI, a negative result within the first hours of the onset of symptoms does not rule out myocardial infarction with certainty. If myocardial infarction is still suspected, repeat the test at appropriate intervals.   I-Stat Chem 8, ED     Status: Abnormal   Collection Time: 12/12/2014  4:27 PM  Result Value Ref Range   Sodium 138 137 - 147 mEq/L   Potassium 3.6 (L) 3.7 - 5.3 mEq/L   Chloride 105 96 - 112 mEq/L   BUN 32 (H) 6 - 23 mg/dL    Creatinine, Ser 1.40 (H) 0.50 - 1.35 mg/dL   Glucose, Bld 264 (H) 70 - 99 mg/dL   Calcium, Ion 1.17 1.13 - 1.30 mmol/L   TCO2 16 0 - 100 mmol/L   Hemoglobin 12.2 (L) 13.0 - 17.0 g/dL   HCT 36.0 (L) 39.0 - 52.0 %  Troponin I     Status: Abnormal   Collection Time: 12/20/2014  5:45 PM  Result Value Ref Range   Troponin I 13.79 (HH) <0.30 ng/mL    Comment:        Due to the release kinetics of cTnI, a negative result within the first hours of the onset of symptoms does not rule out myocardial infarction with certainty. If myocardial infarction is still suspected, repeat the test at appropriate intervals. CRITICAL RESULT CALLED TO, READ BACK BY AND VERIFIED WITH: S.STOWE,RN [id number] 12/11/2014 M.CAMPBELL   Basic metabolic panel     Status: Abnormal   Collection Time: 11/22/2014  5:45 PM  Result Value Ref Range   Sodium 137 137 - 147 mEq/L   Potassium 3.9 3.7 - 5.3 mEq/L   Chloride 99 96 - 112 mEq/L   CO2 18 (L)  19 - 32 mEq/L   Glucose, Bld 251 (H) 70 - 99 mg/dL   BUN 29 (H) 6 - 23 mg/dL   Creatinine, Ser 1.46 (H) 0.50 - 1.35 mg/dL   Calcium 8.3 (L) 8.4 - 10.5 mg/dL   GFR calc non Af Amer 43 (L) >90 mL/min   GFR calc Af Amer 50 (L) >90 mL/min    Comment: (NOTE) The eGFR has been calculated using the CKD EPI equation. This calculation has not been validated in all clinical situations. eGFR's persistently <90 mL/min signify possible Chronic Kidney Disease.    Anion gap 20 (H) 5 - 15  Protime-INR now and repeat in 8 hours     Status: Abnormal   Collection Time: 12/17/2014  5:45 PM  Result Value Ref Range   Prothrombin Time 16.1 (H) 11.6 - 15.2 seconds   INR 1.28 0.00 - 1.49  APTT now and repeat in 8 hours     Status: Abnormal   Collection Time: 12/04/2014  5:45 PM  Result Value Ref Range   aPTT 128 (H) 24 - 37 seconds    Comment:        IF BASELINE aPTT IS ELEVATED, SUGGEST PATIENT RISK ASSESSMENT BE USED TO DETERMINE APPROPRIATE ANTICOAGULANT THERAPY.   Glucose, capillary      Status: Abnormal   Collection Time: 12/12/2014  6:29 PM  Result Value Ref Range   Glucose-Capillary 233 (H) 70 - 99 mg/dL  Basic metabolic panel     Status: Abnormal   Collection Time: 11/23/2014  6:30 PM  Result Value Ref Range   Sodium 138 137 - 147 mEq/L   Potassium 3.9 3.7 - 5.3 mEq/L   Chloride 100 96 - 112 mEq/L   CO2 18 (L) 19 - 32 mEq/L   Glucose, Bld 255 (H) 70 - 99 mg/dL   BUN 30 (H) 6 - 23 mg/dL   Creatinine, Ser 1.46 (H) 0.50 - 1.35 mg/dL   Calcium 8.5 8.4 - 10.5 mg/dL   GFR calc non Af Amer 43 (L) >90 mL/min   GFR calc Af Amer 50 (L) >90 mL/min    Comment: (NOTE) The eGFR has been calculated using the CKD EPI equation. This calculation has not been validated in all clinical situations. eGFR's persistently <90 mL/min signify possible Chronic Kidney Disease.    Anion gap 20 (H) 5 - 15  Glucose, capillary     Status: Abnormal   Collection Time: 11/24/2014  7:54 PM  Result Value Ref Range   Glucose-Capillary 208 (H) 70 - 99 mg/dL  MRSA PCR Screening     Status: Abnormal   Collection Time: 12/06/2014  8:13 PM  Result Value Ref Range   MRSA by PCR POSITIVE (A) NEGATIVE    Comment:        The GeneXpert MRSA Assay (FDA approved for NASAL specimens only), is one component of a comprehensive MRSA colonization surveillance program. It is not intended to diagnose MRSA infection nor to guide or monitor treatment for MRSA infections. RESULT CALLED TO, READ BACK BY AND VERIFIED WITH: [facility] RN [id number] 12/04/2014 MITCHELL,L   Glucose, capillary     Status: Abnormal   Collection Time: 12/02/2014  9:01 PM  Result Value Ref Range   Glucose-Capillary 228 (H) 70 - 99 mg/dL  Troponin I     Status: Abnormal   Collection Time: 12/06/2014  9:18 PM  Result Value Ref Range   Troponin I >20.00 (HH) <0.30 ng/mL    Comment:  Due to the release kinetics of cTnI, a negative result within the first hours of the onset of symptoms does not rule out myocardial infarction with certainty. If  myocardial infarction is still suspected, repeat the test at appropriate intervals. CRITICAL VALUE NOTED.  VALUE IS CONSISTENT WITH PREVIOUSLY REPORTED AND CALLED VALUE.   Basic metabolic panel     Status: Abnormal   Collection Time: 12/17/2014  9:18 PM  Result Value Ref Range   Sodium 137 137 - 147 mEq/L   Potassium 3.1 (L) 3.7 - 5.3 mEq/L    Comment: DELTA CHECK NOTED NO VISIBLE HEMOLYSIS    Chloride 100 96 - 112 mEq/L   CO2 21 19 - 32 mEq/L   Glucose, Bld 234 (H) 70 - 99 mg/dL   BUN 32 (H) 6 - 23 mg/dL   Creatinine, Ser 1.49 (H) 0.50 - 1.35 mg/dL   Calcium 8.5 8.4 - 10.5 mg/dL   GFR calc non Af Amer 42 (L) >90 mL/min   GFR calc Af Amer 49 (L) >90 mL/min    Comment: (NOTE) The eGFR has been calculated using the CKD EPI equation. This calculation has not been validated in all clinical situations. eGFR's persistently <90 mL/min signify possible Chronic Kidney Disease.    Anion gap 16 (H) 5 - 15  CBC     Status: Abnormal   Collection Time: 12/19/2014  9:45 PM  Result Value Ref Range   WBC 27.2 (H) 4.0 - 10.5 K/uL   RBC 3.75 (L) 4.22 - 5.81 MIL/uL   Hemoglobin 11.4 (L) 13.0 - 17.0 g/dL   HCT 35.5 (L) 39.0 - 52.0 %   MCV 94.7 78.0 - 100.0 fL   MCH 30.4 26.0 - 34.0 pg   MCHC 32.1 30.0 - 36.0 g/dL   RDW 16.9 (H) 11.5 - 15.5 %   Platelets 428 (H) 150 - 400 K/uL  Glucose, capillary     Status: Abnormal   Collection Time: 12/11/2014 10:52 PM  Result Value Ref Range   Glucose-Capillary 206 (H) 70 - 99 mg/dL   Comment 1 Venous Sample   Basic metabolic panel     Status: Abnormal   Collection Time: 12/07/2014 11:54 PM  Result Value Ref Range   Sodium 138 137 - 147 mEq/L   Potassium 3.5 (L) 3.7 - 5.3 mEq/L   Chloride 102 96 - 112 mEq/L   CO2 20 19 - 32 mEq/L   Glucose, Bld 228 (H) 70 - 99 mg/dL   BUN 34 (H) 6 - 23 mg/dL   Creatinine, Ser 1.48 (H) 0.50 - 1.35 mg/dL   Calcium 8.8 8.4 - 10.5 mg/dL   GFR calc non Af Amer 42 (L) >90 mL/min   GFR calc Af Amer 49 (L) >90 mL/min     Comment: (NOTE) The eGFR has been calculated using the CKD EPI equation. This calculation has not been validated in all clinical situations. eGFR's persistently <90 mL/min signify possible Chronic Kidney Disease.    Anion gap 16 (H) 5 - 15  Glucose, capillary     Status: Abnormal   Collection Time: 12/12/2014 11:54 PM  Result Value Ref Range   Glucose-Capillary 202 (H) 70 - 99 mg/dL  I-STAT 3, arterial blood gas (G3+)     Status: Abnormal   Collection Time: 12/09/14 12:51 AM  Result Value Ref Range   pH, Arterial 7.320 (L) 7.350 - 7.450   pCO2 arterial 38.2 35.0 - 45.0 mmHg   pO2, Arterial 335.0 (H) 80.0 - 100.0 mmHg  Bicarbonate 20.9 20.0 - 24.0 mEq/L   TCO2 22 0 - 100 mmol/L   O2 Saturation 100.0 %   Acid-base deficit 6.0 (H) 0.0 - 2.0 mmol/L   Patient temperature 32.1 C    Collection site ARTERIAL LINE    Drawn by RT    Sample type ARTERIAL   Glucose, capillary     Status: Abnormal   Collection Time: 12/09/14  1:16 AM  Result Value Ref Range   Glucose-Capillary 186 (H) 70 - 99 mg/dL  Basic metabolic panel     Status: Abnormal   Collection Time: 12/09/14  1:45 AM  Result Value Ref Range   Sodium 141 137 - 147 mEq/L   Potassium 3.9 3.7 - 5.3 mEq/L   Chloride 105 96 - 112 mEq/L   CO2 18 (L) 19 - 32 mEq/L   Glucose, Bld 173 (H) 70 - 99 mg/dL   BUN 38 (H) 6 - 23 mg/dL   Creatinine, Ser 1.51 (H) 0.50 - 1.35 mg/dL   Calcium 8.5 8.4 - 10.5 mg/dL   GFR calc non Af Amer 41 (L) >90 mL/min   GFR calc Af Amer 48 (L) >90 mL/min    Comment: (NOTE) The eGFR has been calculated using the CKD EPI equation. This calculation has not been validated in all clinical situations. eGFR's persistently <90 mL/min signify possible Chronic Kidney Disease.    Anion gap 18 (H) 5 - 15  Protime-INR now and repeat in 8 hours     Status: None   Collection Time: 12/09/14  1:45 AM  Result Value Ref Range   Prothrombin Time 14.8 11.6 - 15.2 seconds   INR 1.14 0.00 - 1.49  APTT now and repeat in 8  hours     Status: None   Collection Time: 12/09/14  1:45 AM  Result Value Ref Range   aPTT 35 24 - 37 seconds  Glucose, capillary     Status: Abnormal   Collection Time: 12/09/14  2:23 AM  Result Value Ref Range   Glucose-Capillary 164 (H) 70 - 99 mg/dL  Glucose, capillary     Status: Abnormal   Collection Time: 12/09/14  3:29 AM  Result Value Ref Range   Glucose-Capillary 142 (H) 70 - 99 mg/dL  Urinalysis, Routine w reflex microscopic     Status: Abnormal   Collection Time: 12/09/14  5:27 AM  Result Value Ref Range   Color, Urine AMBER (A) YELLOW    Comment: BIOCHEMICALS MAY BE AFFECTED BY COLOR   APPearance CLOUDY (A) CLEAR   Specific Gravity, Urine 1.028 1.005 - 1.030   pH 5.0 5.0 - 8.0   Glucose, UA NEGATIVE NEGATIVE mg/dL   Hgb urine dipstick LARGE (A) NEGATIVE   Bilirubin Urine NEGATIVE NEGATIVE   Ketones, ur NEGATIVE NEGATIVE mg/dL   Protein, ur 100 (A) NEGATIVE mg/dL   Urobilinogen, UA 1.0 0.0 - 1.0 mg/dL   Nitrite NEGATIVE NEGATIVE   Leukocytes, UA SMALL (A) NEGATIVE  Urine microscopic-add on     Status: Abnormal   Collection Time: 12/09/14  5:27 AM  Result Value Ref Range   Squamous Epithelial / LPF FEW (A) RARE   WBC, UA 11-20 <3 WBC/hpf   RBC / HPF 21-50 <3 RBC/hpf   Bacteria, UA MANY (A) RARE   Casts GRANULAR CAST (A) NEGATIVE  Troponin I     Status: Abnormal   Collection Time: 12/09/14  5:45 AM  Result Value Ref Range   Troponin I >20.00 (HH) <0.30 ng/mL  Comment:        Due to the release kinetics of cTnI, a negative result within the first hours of the onset of symptoms does not rule out myocardial infarction with certainty. If myocardial infarction is still suspected, repeat the test at appropriate intervals. CRITICAL VALUE NOTED.  VALUE IS CONSISTENT WITH PREVIOUSLY REPORTED AND CALLED VALUE.   Basic metabolic panel     Status: Abnormal   Collection Time: 12/09/14  5:45 AM  Result Value Ref Range   Sodium 142 137 - 147 mEq/L   Potassium 3.6 (L)  3.7 - 5.3 mEq/L   Chloride 107 96 - 112 mEq/L   CO2 17 (L) 19 - 32 mEq/L   Glucose, Bld 135 (H) 70 - 99 mg/dL   BUN 38 (H) 6 - 23 mg/dL   Creatinine, Ser 1.47 (H) 0.50 - 1.35 mg/dL   Calcium 8.4 8.4 - 10.5 mg/dL   GFR calc non Af Amer 43 (L) >90 mL/min   GFR calc Af Amer 49 (L) >90 mL/min    Comment: (NOTE) The eGFR has been calculated using the CKD EPI equation. This calculation has not been validated in all clinical situations. eGFR's persistently <90 mL/min signify possible Chronic Kidney Disease.    Anion gap 18 (H) 5 - 15  Glucose, capillary     Status: Abnormal   Collection Time: 12/09/14  5:50 AM  Result Value Ref Range   Glucose-Capillary 128 (H) 70 - 99 mg/dL    Imaging: Dg Chest Port 1 View  12/09/2014   CLINICAL DATA:  Difficulty breathing/hypoxia  EXAM: PORTABLE CHEST - 1 VIEW  COMPARISON:  December 08, 2014  FINDINGS: Endotracheal tube tip is 5.7 cm above the carina. Central catheter tip is in the superior vena cava. Nasogastric tube tip and side port in stomach. No pneumothorax. There is a focal area of left base atelectasis. Elsewhere lungs are clear. Heart is upper normal in size with pulmonary vascularity within normal limits. No adenopathy. No bone lesions.  IMPRESSION: Tube and catheter positions as described without pneumothorax. Atelectasis left base. This finding may represent early pneumonia. Elsewhere lungs clear. No change in cardiac silhouette.   Electronically Signed   By: Lowella Grip M.D.   On: 12/09/2014 07:01   Dg Chest Port 1 View  12/11/2014   CLINICAL DATA:  Respiratory failure.  Intubated.  EXAM: PORTABLE CHEST - 1 VIEW  COMPARISON:  Portable film earlier today.  FINDINGS: Endotracheal tube has been advanced and now lies 16 mm above the carina. The heart remains mildly enlarged. No focal lung opacities. RIGHT IJ catheter tip cavoatrial junction. No pneumothorax.  IMPRESSION: Satisfactory ETT placement.  No active cardiopulmonary disease.    Electronically Signed   By: Rolla Flatten M.D.   On: 12/17/2014 19:37   Dg Chest Portable 1 View  12/10/2014   CLINICAL DATA:  Difficult intubation  EXAM: PORTABLE CHEST - 1 VIEW  COMPARISON:  11/22/2014  FINDINGS: Endotracheal tube at the thoracic inlet.  Cardiomegaly with pulmonary vascular congestion. Increased interstitial markings, favored to be chronic. Mild interstitial edema is possible. No pleural effusion or pneumothorax.  Right IJ venous catheter terminates in the lower SVC.  IMPRESSION: Endotracheal tube at the thoracic inlet.  Right IJ venous catheter terminates in the lower SVC. No pneumothorax.   Electronically Signed   By: Julian Hy M.D.   On: 12/06/2014 17:41   Dg Chest Portable 1 View  12/09/2014   CLINICAL DATA:  Cardiac arrest.  EXAM: PORTABLE  CHEST - 1 VIEW  COMPARISON:  None.  FINDINGS: Cardiomegaly. Mild vascular congestion. Telemetry leads overlie the chest. Prior cervical fusion. No effusion or pneumothorax.  IMPRESSION: Cardiomegaly with mild vascular congestion.   Electronically Signed   By: Rolla Flatten M.D.   On: 12/20/2014 17:17   Dg Abd Portable 1v  12/09/2014   CLINICAL DATA:  Nasogastric tube placement  EXAM: PORTABLE ABDOMEN - 1 VIEW  COMPARISON:  None.  FINDINGS: Nasogastric tube tip lies in the stomach. Visualized bowel gas unremarkable.  IMPRESSION: NG tube tip in the stomach.   Electronically Signed   By: Rolla Flatten M.D.   On: 12/13/2014 19:38    Assessment:  Active Problems:   Cardiac arrest   Acute respiratory failure   Atrial fibrillation with rapid ventricular response   Plan:  1. New onset a-fib overnight - also with NSVT. Currently on arctic sun protocol. Plan start heparin today. Check occult stool for blood. Had multiple loose stools - ?c. Difficile or ischemic colitis. Will recheck echo tomorrow, once he has rewarmed.  Time Spent Directly with Patient:  15 minutes  Length of Stay:  LOS: 1 day   Pixie Casino, MD,  [facility] Attending Cardiologist [facility]  Kavi Almquist C 12/09/2014, 9:16 AM

## 2014-12-09 NOTE — Procedures (Addendum)
Arterial Catheter Insertion Procedure Note [name] [id number] 1930-02-21  Procedure: Insertion of Arterial Catheter  Indications: Blood pressure monitoring  Procedure Details Consent: Unable to obtain consent because of altered level of consciousness. Time Out: Verified patient identification, verified procedure, site/side was marked, verified correct patient position, special equipment/implants available, medications/allergies/relevent history reviewed, required imaging and test results available.  Performed  Maximum sterile technique was used including antiseptics, cap, gloves, gown, hand hygiene, mask and sheet. Skin prep: Chlorhexidine; local anesthetic administered 20 gauge catheter was inserted into left radial artery using the Seldinger technique.  Evaluation Blood flow good; BP tracing good. Complications: No apparent complications.   Placed Arterial catheter into pt. Left radial artery, with MD permission. No apparent complications, pt. Had good blood flow, and sterile procedures were met.    [name] 12/09/2014

## 2014-12-09 NOTE — Progress Notes (Signed)
PULMONARY / CRITICAL CARE MEDICINE   Name: [name] MRN: [id number] DOB: [birth year]    ADMISSION DATE:  11/30/2014 CONSULTATION DATE:  12/19  REFERRING MD :  Leanne Lovely   CHIEF COMPLAINT:  Cardiopulmonary arrest   INITIAL PRESENTATION:  This is a 80 year old male. Only known hx on admit was prior cardiac hx w/ EF in 71s by [facility]. Was visiting his wife who is a patient at [facility] on 12/19. Was riding in car on way home  when slumped over and became unresponsive. EMS response time unclear. On arrival was in VF. Underwent ACLS estimated at ~20 minutes to ROSC. PCCM asked to admit s/p arrest    STUDIES:  Cardiac cath 12/19>>>  SIGNIFICANT EVENTS: 12/19 cardiac arrest. Estimated down time ~ 20 mintues 12/19: cath lab>>> 12/19 hypothermia started.    SUBJECTIVE:  Sedated, paralyzed, at goal temp norepi up to 10 CVP 4-5  VITAL SIGNS: Temp:  [89.6 F (32 C)-94.5 F (34.7 C)] 91.9 F (33.3 C) (12/20 0800) Pulse Rate:  [30-139] 45 (12/20 0900) Resp:  [10-22] 14 (12/20 0900) BP: (90-164)/(37-130) 109/72 mmHg (12/20 0803) SpO2:  [87 %-100 %] 100 % (12/20 0900) Arterial Line BP: (88-140)/(55-95) 122/71 mmHg (12/20 0900) FiO2 (%):  [50 %-100 %] 50 % (12/20 0803) Weight:  [80 kg (176 lb 5.9 oz)] 80 kg (176 lb 5.9 oz) (12/19 1703) HEMODYNAMICS: CVP:  [4 mmHg-8 mmHg] 4 mmHg VENTILATOR SETTINGS: Vent Mode:  [-] PRVC FiO2 (%):  [50 %-100 %] 50 % Set Rate:  [14 bmp-16 bmp] 14 bmp Vt Set:  [550 mL-650 mL] 650 mL PEEP:  [5 cmH20] 5 cmH20 Plateau Pressure:  [14 cmH20-20 cmH20] 14 cmH20 INTAKE / OUTPUT:  Intake/Output Summary (Last 24 hours) at 12/09/14 3428 Last data filed at 12/09/14 0800  Gross per 24 hour  Intake 1052.9 ml  Output    730 ml  Net  322.9 ml    PHYSICAL EXAMINATION: General:  Elderly 80 year old male, unresponsive on full vent support  Neuro:  Gcs 3, No spont movement.  HEENT:  Edentulous, + JVD, now orally intubated   Cardiovascular:  Reg, no MRG   Lungs:  Scattered rhonchi  Abdomen:  Soft, non-tender, + bowel sounds, old mid abd scar healed  Musculoskeletal:  Intact  Skin:  Cool and mottled   LABS:  CBC  Recent Labs Lab 11/30/2014 1619 11/22/2014 1627 11/24/2014 2145  WBC 9.5  --  27.2*  HGB 10.5* 12.2* 11.4*  HCT 33.6* 36.0* 35.5*  PLT 294  --  428*   Coag's  Recent Labs Lab 12/12/2014 1619 12/07/2014 1745 12/09/14 0145  APTT 40* 128* 35  INR 1.32 1.28 1.14   BMET  Recent Labs Lab 12/07/2014 2354 12/09/14 0145 12/09/14 0545  NA 138 141 142  K 3.5* 3.9 3.6*  CL 102 105 107  CO2 20 18* 17*  BUN 34* 38* 38*  CREATININE 1.48* 1.51* 1.47*  GLUCOSE 228* 173* 135*   Electrolytes  Recent Labs Lab 11/29/2014 2354 12/09/14 0145 12/09/14 0545  CALCIUM 8.8 8.5 8.4   Sepsis Markers No results for input(s): LATICACIDVEN, PROCALCITON, O2SATVEN in the last 168 hours. ABG  Recent Labs Lab 12/09/14 0051  PHART 7.320*  PCO2ART 38.2  PO2ART 335.0*   Liver Enzymes No results for input(s): AST, ALT, ALKPHOS, BILITOT, ALBUMIN in the last 168 hours. Cardiac Enzymes  Recent Labs Lab 12/04/2014 1745 11/26/2014 2118 12/09/14 0545  TROPONINI 13.79* >20.00* >20.00*   Glucose  Recent Labs  Lab 11/21/2014 2252 12/03/2014 2354 12/09/14 0116 12/09/14 0223 12/09/14 0329 12/09/14 0550  GLUCAP 206* 202* 186* 164* 142* 128*    Imaging Dg Chest Port 1 View  12/07/2014   CLINICAL DATA:  Respiratory failure.  Intubated.  EXAM: PORTABLE CHEST - 1 VIEW  COMPARISON:  Portable film earlier today.  FINDINGS: Endotracheal tube has been advanced and now lies 16 mm above the carina. The heart remains mildly enlarged. No focal lung opacities. RIGHT IJ catheter tip cavoatrial junction. No pneumothorax.  IMPRESSION: Satisfactory ETT placement.  No active cardiopulmonary disease.   Electronically Signed   By: Rolla Flatten M.D.   On: 12/16/2014 19:37   Dg Chest Portable 1 View  12/14/2014   CLINICAL DATA:  Difficult intubation  EXAM:  PORTABLE CHEST - 1 VIEW  COMPARISON:  12/04/2014  FINDINGS: Endotracheal tube at the thoracic inlet.  Cardiomegaly with pulmonary vascular congestion. Increased interstitial markings, favored to be chronic. Mild interstitial edema is possible. No pleural effusion or pneumothorax.  Right IJ venous catheter terminates in the lower SVC.  IMPRESSION: Endotracheal tube at the thoracic inlet.  Right IJ venous catheter terminates in the lower SVC. No pneumothorax.   Electronically Signed   By: Julian Hy M.D.   On: 12/15/2014 17:41   Dg Chest Portable 1 View  11/28/2014   CLINICAL DATA:  Cardiac arrest.  EXAM: PORTABLE CHEST - 1 VIEW  COMPARISON:  None.  FINDINGS: Cardiomegaly. Mild vascular congestion. Telemetry leads overlie the chest. Prior cervical fusion. No effusion or pneumothorax.  IMPRESSION: Cardiomegaly with mild vascular congestion.   Electronically Signed   By: Rolla Flatten M.D.   On: 12/05/2014 17:17   Dg Abd Portable 1v  12/09/2014   CLINICAL DATA:  Nasogastric tube placement  EXAM: PORTABLE ABDOMEN - 1 VIEW  COMPARISON:  None.  FINDINGS: Nasogastric tube tip lies in the stomach. Visualized bowel gas unremarkable.  IMPRESSION: NG tube tip in the stomach.   Electronically Signed   By: Rolla Flatten M.D.   On: 11/27/2014 19:38     ASSESSMENT / PLAN:  PULMONARY OETT 12/19>>> A: acute respiratory failure s/p cardiopulmonary arrest.  Pulmonary infiltrates-->improved w/ positive pressure  P:   Full vent support Hypothermia protocol > assess for ability to do PSV once warmed  CARDIOVASCULAR CVL right IJ CVL 12/19>>> A:  VT arrest Cardiogenic shock P:  No intervention made on L heart cath Amiodarone  RENAL A:   AKI w/ + anion gap metabolic acidosis s/p cardiac arrest, S Cr stable 12/20 P:   IV hydration Renal dose meds Strict I&O F/u chem  GASTROINTESTINAL A:  Prior surgical hx/ unknown Diarrhea P:   SUP w/ PPI C diff screen was sent 12/19  HEMATOLOGIC A:   Anemia of unclear chronicity   P:  Trend cbc Defer heparin to cards   INFECTIOUS A:  R/o aspiration  P:   Sputum 12/19>> Abx: Unasyn 12/19>>>  CXR clearing, possible LLL infiltrate, continue unasyn  ENDOCRINE A:  Hyperglycemia  P:   ssi   NEUROLOGIC A:   Acute encephalopathy  Worried about HIE. Down-time ~20 minutes  P:   RASS goal: -4 Hypothermia protocol: will get NMB and fentanyl gtt  Assess MS on rewarming, then discuss prognosis and goals w family   FAMILY  - Updates: pt's wife is an inpatient at [facility], I have not spoken with her as of 12/20  - Inter-disciplinary family meet or Palliative Care meeting due by:  12/27  TODAY'S SUMMARY: VF arrest. No intervention indicated on L cath. Continue hypothermia protocol, amiodarone. Assess MS and resp status after warmed  Independent CC time 45 minutes  Baltazar Apo, MD, PhD 12/09/2014, 9:27 AM [facility] [phone] or if no answer [phone]

## 2014-12-09 NOTE — Progress Notes (Signed)
Utilization Review Completed.[name] T12/20/2015  

## 2014-12-09 NOTE — Progress Notes (Signed)
[facility] Progress Note Patient Name: [name] DOB: [birth year] MRN: [id number]   Date of Service  12/09/2014  HPI/Events of Note  Multiple loose stools  eICU Interventions  Insert rectal tube Check C Diff        DETERDING,ELIZABETH 12/09/2014, 5:25 AM

## 2014-12-10 ENCOUNTER — Inpatient Hospital Stay (HOSPITAL_COMMUNITY): Payer: Medicare Other

## 2014-12-10 DIAGNOSIS — J690 Pneumonitis due to inhalation of food and vomit: Secondary | ICD-10-CM

## 2014-12-10 DIAGNOSIS — I469 Cardiac arrest, cause unspecified: Secondary | ICD-10-CM

## 2014-12-10 DIAGNOSIS — J9601 Acute respiratory failure with hypoxia: Secondary | ICD-10-CM

## 2014-12-10 DIAGNOSIS — J9690 Respiratory failure, unspecified, unspecified whether with hypoxia or hypercapnia: Secondary | ICD-10-CM | POA: Diagnosis present

## 2014-12-10 DIAGNOSIS — I4891 Unspecified atrial fibrillation: Secondary | ICD-10-CM

## 2014-12-10 DIAGNOSIS — I059 Rheumatic mitral valve disease, unspecified: Secondary | ICD-10-CM

## 2014-12-10 LAB — CBC
HCT: 35.7 % — ABNORMAL LOW (ref 39.0–52.0)
Hemoglobin: 12.2 g/dL — ABNORMAL LOW (ref 13.0–17.0)
MCH: 30.8 pg (ref 26.0–34.0)
MCHC: 34.2 g/dL (ref 30.0–36.0)
MCV: 90.2 fL (ref 78.0–100.0)
PLATELETS: 384 10*3/uL (ref 150–400)
RBC: 3.96 MIL/uL — ABNORMAL LOW (ref 4.22–5.81)
RDW: 16.5 % — AB (ref 11.5–15.5)
WBC: 27.3 10*3/uL — ABNORMAL HIGH (ref 4.0–10.5)

## 2014-12-10 LAB — GLUCOSE, CAPILLARY
GLUCOSE-CAPILLARY: 124 mg/dL — AB (ref 70–99)
GLUCOSE-CAPILLARY: 135 mg/dL — AB (ref 70–99)
GLUCOSE-CAPILLARY: 46 mg/dL — AB (ref 70–99)
GLUCOSE-CAPILLARY: 83 mg/dL (ref 70–99)
Glucose-Capillary: 133 mg/dL — ABNORMAL HIGH (ref 70–99)
Glucose-Capillary: 107 mg/dL — ABNORMAL HIGH (ref 70–99)
Glucose-Capillary: 112 mg/dL — ABNORMAL HIGH (ref 70–99)
Glucose-Capillary: 107 mg/dL — ABNORMAL HIGH (ref 70–99)

## 2014-12-10 LAB — BASIC METABOLIC PANEL
ANION GAP: 16 — AB (ref 5–15)
Anion gap: 15 (ref 5–15)
Anion gap: 15 (ref 5–15)
BUN: 37 mg/dL — ABNORMAL HIGH (ref 6–23)
BUN: 36 mg/dL — ABNORMAL HIGH (ref 6–23)
BUN: 37 mg/dL — AB (ref 6–23)
CALCIUM: 8.6 mg/dL (ref 8.4–10.5)
CHLORIDE: 105 meq/L (ref 96–112)
CO2: 19 mEq/L (ref 19–32)
CO2: 19 mEq/L (ref 19–32)
CO2: 19 mEq/L (ref 19–32)
CREATININE: 1.51 mg/dL — AB (ref 0.50–1.35)
Calcium: 8.3 mg/dL — ABNORMAL LOW (ref 8.4–10.5)
Calcium: 8.3 mg/dL — ABNORMAL LOW (ref 8.4–10.5)
Chloride: 107 mEq/L (ref 96–112)
Chloride: 108 mEq/L (ref 96–112)
Creatinine, Ser: 1.56 mg/dL — ABNORMAL HIGH (ref 0.50–1.35)
Creatinine, Ser: 1.59 mg/dL — ABNORMAL HIGH (ref 0.50–1.35)
GFR calc Af Amer: 48 mL/min — ABNORMAL LOW (ref >90)
GFR calc Af Amer: 46 mL/min — ABNORMAL LOW (ref >90)
GFR calc Af Amer: 45 mL/min — ABNORMAL LOW (ref >90)
GFR calc non Af Amer: 41 mL/min — ABNORMAL LOW (ref >90)
GFR calc non Af Amer: 39 mL/min — ABNORMAL LOW (ref >90)
GFR, EST NON AFRICAN AMERICAN: 40 mL/min — AB (ref >90)
GLUCOSE: 137 mg/dL — AB (ref 70–99)
GLUCOSE: 123 mg/dL — AB (ref 70–99)
Glucose, Bld: 131 mg/dL — ABNORMAL HIGH (ref 70–99)
POTASSIUM: 4.2 meq/L (ref 3.7–5.3)
Potassium: 3.9 mEq/L (ref 3.7–5.3)
Potassium: 4 mEq/L (ref 3.7–5.3)
SODIUM: 141 meq/L (ref 137–147)
Sodium: 140 mEq/L (ref 137–147)
Sodium: 142 mEq/L (ref 137–147)

## 2014-12-10 LAB — HEPARIN LEVEL (UNFRACTIONATED)
HEPARIN UNFRACTIONATED: 0.47 [IU]/mL (ref 0.30–0.70)
Heparin Unfractionated: 0.59 IU/mL (ref 0.30–0.70)

## 2014-12-10 LAB — MAGNESIUM: Magnesium: 2.2 mg/dL (ref 1.5–2.5)

## 2014-12-10 MED ORDER — DEXTROSE 5 % IV SOLN
0.0000 ug/min | INTRAVENOUS | Status: DC
  Administered 2014-12-10: 19 ug/min via INTRAVENOUS
  Filled 2014-12-10: qty 16

## 2014-12-10 MED ORDER — SODIUM CHLORIDE 0.9 % IV BOLUS (SEPSIS)
250.0000 mL | Freq: Once | INTRAVENOUS | Status: AC
Start: 2014-12-10 — End: 2014-12-10
  Administered 2014-12-10: 250 mL via INTRAVENOUS

## 2014-12-10 MED ORDER — VITAL AF 1.2 CAL PO LIQD
1000.0000 mL | ORAL | Status: DC
  Administered 2014-12-10: 1000 mL
  Filled 2014-12-10 (×4): qty 1000

## 2014-12-10 MED ORDER — VITAL HIGH PROTEIN PO LIQD: 1000.0000 mL | ORAL | Status: DC

## 2014-12-10 NOTE — Progress Notes (Signed)
Dr Kennith Center advised to give 250cc bolus. CVP's have been low. Will continue to monitor.

## 2014-12-10 NOTE — Progress Notes (Signed)
  Echocardiogram 2D Echocardiogram has been performed.  Donata Clay 12/10/2014, 10:35 AM

## 2014-12-10 NOTE — Procedures (Signed)
ELECTROENCEPHALOGRAM REPORT  Patient: [name]       Room #: 3Z32 EEG No. ID: [id number] Age: 80 y.o.        Sex: male Referring Physician: Martinique, P Report Date:  12/10/2014        Interpreting Physician: Anthony Sar  History: [name] is an 79 y.o. male status post cardiac arrest on 12/20/2014. Patient underwent hypothermia protocol. He continues to be unresponsive and intubated on mechanical ventilation.  Indications for study:  Assess severity of encephalopathy; rule out seizure activity.  Technique: This is an 18 channel routine scalp EEG performed at the bedside with bipolar and monopolar montages arranged in accordance to the international 10/20 system of electrode placement.   Description: Predominant background activity consisted of markedly suppressed cerebral activity diffusely with diffuse delta activity with superimposed intermittent runs of theta activity which was most prominent centrally. Photic stimulation and hyperventilation were not performed. No epileptiform discharges were recorded.  Interpretation: This EEG is abnormal with diffuse low amplitude severe slowing of cerebral activity continuously. No evidence of an epileptic disorder was demonstrated.   Rush Farmer M.D. [facility] [phone]

## 2014-12-10 NOTE — Progress Notes (Signed)
ANTICOAGULATION CONSULT NOTE - Follow Up Consult  Pharmacy Consult for Heparin Indication: atrial fibrillation  No Known Allergies  Patient Measurements: Height: 6\' 2"  (188 cm) Weight: 176 lb 5.9 oz (80 kg) IBW/kg (Calculated) : 82.2 Heparin Dosing Weight: 80kg  Vital Signs: Temp: 99 F (37.2 C) (12/21 1300) Temp Source: Core (Comment) (12/21 1300) BP: 136/60 mmHg (12/21 1159) Pulse Rate: 98 (12/21 1159)  Labs:  Recent Labs  11/27/2014 1619  11/30/2014 1745  11/25/2014 2118 11/27/2014 2145  12/09/14 0145 12/09/14 0545  12/09/14 1145 12/09/14 1408 12/09/14 1741 12/09/14 1745  12/10/14 0219 12/10/14 0400 12/10/14 0720 12/10/14 1030  HGB 10.5*  < >  --   --   --  11.4*  --   --   --   < >  --  11.9* 11.9*  --   --   --  12.2*  --   --   HCT 33.6*  < >  --   --   --  35.5*  --   --   --   < >  --  35.0* 35.0*  --   --   --  35.7*  --   --   PLT 294  --   --   --   --  428*  --   --   --   --   --   --   --   --   --   --  384  --   --   APTT 40*  --  128*  --   --   --   --  35  --   --   --   --   --   --   --   --   --   --   --   LABPROT 16.5*  --  16.1*  --   --   --   --  14.8  --   --   --   --   --   --   --   --   --   --   --   INR 1.32  --  1.28  --   --   --   --  1.14  --   --   --   --   --   --   --   --   --   --   --   HEPARINUNFRC  --   --   --   --   --   --   --   --   --   --   --   --   --  0.12*  --   --  0.59  --  0.47  CREATININE 1.34  < > 1.46*  < > 1.49*  --   < > 1.51* 1.47*  < >  --  1.60* 1.60*  --   < > 1.51* 1.56* 1.59*  --   TROPONINI  --   < > 13.79*  --  >20.00*  --   --   --  >20.00*  --  >20.00*  --   --   --   --   --   --   --   --   < > = values in this interval not displayed.  Estimated Creatinine Clearance: 40.5 mL/min (by C-G formula based on Cr of 1.59).   Medications:  Heparin @ 900 units/hr  Assessment: 84yom started on IV heparin for  new onset afib. Was undergoing hypothermia protocol, but rewarming completed at ~10 am today.  Heparin level is therapeutic. CBC is stable. No bleeding reported.  Goal of Therapy:  Heparin level 0.3-0.7 units/ml Monitor platelets by anticoagulation protocol: Yes   Plan:  1) Continue heparin at 900 units/hr 2) Daily heparin level and CBC  Deboraha Sprang 12/10/2014,1:54 PM

## 2014-12-10 NOTE — Progress Notes (Signed)
ANTICOAGULATION CONSULT NOTE - Follow Up Consult  Pharmacy Consult for heparin Indication: atrial fibrillation  Labs:  Recent Labs  11/28/2014 1619  11/22/2014 1745  12/09/2014 2118 12/05/2014 2145  12/09/14 0145 12/09/14 0545 12/09/14 1024 12/09/14 1145 12/09/14 1408 12/09/14 1741 12/09/14 1745 12/09/14 2145 12/10/14 0219 12/10/14 0400  HGB 10.5*  < >  --   --   --  11.4*  --   --   --  13.3  --  11.9* 11.9*  --   --   --   --   HCT 33.6*  < >  --   --   --  35.5*  --   --   --  39.0  --  35.0* 35.0*  --   --   --   --   PLT 294  --   --   --   --  428*  --   --   --   --   --   --   --   --   --   --   --   APTT 40*  --  128*  --   --   --   --  35  --   --   --   --   --   --   --   --   --   LABPROT 16.5*  --  16.1*  --   --   --   --  14.8  --   --   --   --   --   --   --   --   --   INR 1.32  --  1.28  --   --   --   --  1.14  --   --   --   --   --   --   --   --   --   HEPARINUNFRC  --   --   --   --   --   --   --   --   --   --   --   --   --  0.12*  --   --  0.59  CREATININE 1.34  < > 1.46*  < > 1.49*  --   < > 1.51* 1.47* 1.60*  --  1.60* 1.60*  --  1.51* 1.51*  --   TROPONINI  --   < > 13.79*  --  >20.00*  --   --   --  >20.00*  --  >20.00*  --   --   --   --   --   --   < > = values in this interval not displayed.   Assessment: 80yo male now therapeutic on heparin after rate increase; started rewarming last pm ~2130.  Will continue gtt at current rate and confirm with additional level.   Wynona Neat, PharmD, BCPS  12/10/2014,4:52 AM

## 2014-12-10 NOTE — Progress Notes (Signed)
EEG Completed; Results Pending  

## 2014-12-10 NOTE — Progress Notes (Signed)
INITIAL NUTRITION ASSESSMENT  DOCUMENTATION CODES Per approved criteria  -Not Applicable   INTERVENTION: Initiate Vital AF 1.2 @ 20 ml/hr via OG tube and increase by 10 ml every 4 hours to goal rate of 60 ml/hr.   Tube feeding regimen provides 1728 kcal (97% of needs), 108 grams of protein, and 1167 ml of H2O.   NUTRITION DIAGNOSIS: Inadequate oral intake related to inability to eat as evidenced by NPO status  Goal: Pt to meet >/= 90% of their estimated nutrition needs   Monitor:  Respiratory status, TF initiation and tolerance, weight trends, labs  Reason for Assessment: Ventilator   80 y.o. male  Admitting Dx: Cardiac Arrest   ASSESSMENT: Pt had cardiac arrest on the way home from visiting his wife who was a pt at [facility]. Pt started on hypothermia 12/19, now rewarmed.   Patient is currently intubated on ventilator support MV: 9 L/min Temp (24hrs), Avg:94.2 F (34.6 C), Min:90 F (32.2 C), Max:99 F (37.2 C)  Propofol: none Pt with diarrhea, c.diff negative.  Unable to complete exam at this time. Pt has on large hypothermia pads and receiving EEG.  Son at bedside and reports that pt has not had any recent weight loss and eating well PTA.  Height: Ht Readings from Last 1 Encounters:  12/10/14 6\' 2"  (1.88 m)    Weight: Wt Readings from Last 1 Encounters:  12/10/14 176 lb (79.833 kg)    Ideal Body Weight: 86.3 kg   % Ideal Body Weight: 92%  Wt Readings from Last 10 Encounters:  12/10/14 176 lb (79.833 kg)    Usual Body Weight: unknown  % Usual Body Weight: -  BMI:  Body mass index is 22.59 kg/(m^2).  Estimated Nutritional Needs: Kcal: 1790 Protein: 95-115 grams Fluid: > 1.7 L/day  Skin: WDL  Diet Order:    EDUCATION NEEDS: -No education needs identified at this time   Intake/Output Summary (Last 24 hours) at 12/10/14 1442 Last data filed at 12/10/14 1400  Gross per 24 hour  Intake 2186.09 ml  Output    960 ml  Net 1226.09 ml    Last BM:  12/21 - flexiseal in place  Labs:   Recent Labs Lab 12/10/14 0219 12/10/14 0400 12/10/14 0720  NA 140 141 142  K 3.9 4.0 4.2  CL 105 107 108  CO2 19 19 19   BUN 37* 36* 37*  CREATININE 1.51* 1.56* 1.59*  CALCIUM 8.3* 8.6 8.3*  MG 2.2  --   --   GLUCOSE 131* 137* 123*    CBG (last 3)   Recent Labs  12/10/14 0718 12/10/14 1218 12/10/14 1221  GLUCAP 112* 46* 107*    Scheduled Meds: . ampicillin-sulbactam (UNASYN) IV  3 g Intravenous Q8H  . antiseptic oral rinse  7 mL Mouth Rinse QID  . artificial tears  1 application Both Eyes 3 times per day  . chlorhexidine  15 mL Mouth Rinse BID  . Chlorhexidine Gluconate Cloth  6 each Topical Q0600  . cisatracurium  0.1 mg/kg Intravenous Once  . fentaNYL  50 mcg Intravenous Once  . insulin aspart  0-15 Units Subcutaneous 6 times per day  . mupirocin ointment  1 application Nasal BID  . pantoprazole (PROTONIX) IV  40 mg Intravenous QHS  . sodium chloride  10-40 mL Intracatheter Q12H  . sodium chloride  3 mL Intravenous Q12H    Continuous Infusions: . cisatracurium (NIMBEX) infusion Stopped (12/10/14 0735)  . fentaNYL infusion INTRAVENOUS Stopped (12/10/14 1000)  .  heparin 900 Units/hr (12/10/14 1342)  . midazolam (VERSED) infusion Stopped (12/10/14 1100)  . norepinephrine (LEVOPHED) Adult infusion 10 mcg/min (12/10/14 1300)    Past Medical History  Diagnosis Date  . HTN (hypertension)   . Hyperlipidemia   . Carotid arterial disease   . CKD (chronic kidney disease) stage 3, GFR 30-59 ml/min     Past Surgical History  Procedure Laterality Date  . Prostate surgery    . Knee surgery    . Left heart catheterization with coronary angiogram N/A 11/26/2014    Procedure: LEFT HEART CATHETERIZATION WITH CORONARY ANGIOGRAM;  Surgeon: Peter M Martinique, MD;  Location: [facility] CATH LAB;  Service: Cardiovascular;  Laterality: N/A;    Maylon Peppers RD, [place], [place] Pager [phone] After Hours Pager

## 2014-12-10 NOTE — Progress Notes (Signed)
DAILY PROGRESS NOTE  Subjective:  Rewarming today - sedated, but no longer paralyzed. Plan for 2D echo today. Remains in atrial fibrillation with decent rate control in the 110-120 range. Not on additional rate controlling medication due to continued need for levophed and hypotension.  Objective:  Temp:  [90 F (32.2 C)-97 F (36.1 C)] 97 F (36.1 C) (12/21 0800) Pulse Rate:  [45-128] 128 (12/21 0741) Resp:  [10-21] 14 (12/21 0800) BP: (115-133)/(62-77) 133/65 mmHg (12/21 0741) SpO2:  [98 %-100 %] 100 % (12/21 0800) Arterial Line BP: (100-137)/(58-78) 134/66 mmHg (12/21 0800) FiO2 (%):  [40 %] 40 % (12/21 0800) Weight change:   Intake/Output from previous day: 12/20 0701 - 12/21 0700 In: 2443.3 [I.V.:1893.3; IV Piggyback:550] Out: 760 [Urine:630; Emesis/NG output:130]  Intake/Output from this shift: Total I/O In: 83.7 [I.V.:83.7] Out: 40 [Urine:40]  Medications: Current Facility-Administered Medications  Medication Dose Route Frequency Provider Last Rate Last Dose  . 0.9 %  sodium chloride infusion  250 mL Intravenous PRN Peter M Martinique, MD 20 mL/hr at 12/09/14 1330 250 mL at 12/09/14 1330  . 0.9 %  sodium chloride infusion   Intravenous PRN Peter M Martinique, MD 10 mL/hr at 12/09/14 0833 500 mL at 12/09/14 0833  . Ampicillin-Sulbactam (UNASYN) 3 g in sodium chloride 0.9 % 100 mL IVPB  3 g Intravenous Q8H Georgina Peer, RPH   3 g at 12/10/14 0401  . antiseptic oral rinse (CPC / CETYLPYRIDINIUM CHLORIDE 0.05%) solution 7 mL  7 mL Mouth Rinse QID Erick Colace, NP   7 mL at 12/10/14 0404  . artificial tears (LACRILUBE) ophthalmic ointment 1 application  1 application Both Eyes 3 times per day Erick Colace, NP   1 application at 32/12/24 [phone]  . chlorhexidine (PERIDEX) 0.12 % solution 15 mL  15 mL Mouth Rinse BID Erick Colace, NP   15 mL at 12/10/14 0724  . Chlorhexidine Gluconate Cloth 2 % PADS 6 each  6 each Topical Q0600 Peter M Martinique, MD   6 each at 12/10/14  [phone]  . cisatracurium (NIMBEX) bolus via infusion 8 mg  0.1 mg/kg Intravenous Once Erick Colace, NP       And  . cisatracurium (NIMBEX) 200 mg in sodium chloride 0.9 % 200 mL (1 mg/mL) infusion  1-1.5 mcg/kg/min Intravenous Continuous Erick Colace, NP   Stopped at 12/10/14 4888   And  . cisatracurium (NIMBEX) bolus via infusion 4 mg  0.05 mg/kg Intravenous PRN Erick Colace, NP      . fentaNYL (SUBLIMAZE) 2,500 mcg in sodium chloride 0.9 % 250 mL (10 mcg/mL) infusion  25-400 mcg/hr Intravenous Continuous Erick Colace, NP 10 mL/hr at 12/10/14 0800 100 mcg/hr at 12/10/14 0800  . fentaNYL (SUBLIMAZE) bolus via infusion 25 mcg  25 mcg Intravenous Q30 min PRN Erick Colace, NP      . fentaNYL (SUBLIMAZE) injection 50 mcg  50 mcg Intravenous Once Erick Colace, NP      . heparin ADULT infusion 100 units/mL (25000 units/250 mL)  900 Units/hr Intravenous Continuous Georgina Peer, [facility] 9 mL/hr at 12/10/14 0800 900 Units/hr at 12/10/14 0800  . insulin aspart (novoLOG) injection 0-15 Units  0-15 Units Subcutaneous 6 times per day Erick Colace, NP   2 Units at 12/10/14 0455  . midazolam (VERSED) 50 mg in sodium chloride 0.9 % 50 mL (1 mg/mL) infusion  1-10 mg/hr Intravenous Continuous Erick Colace, NP 4 mL/hr at  12/10/14 0824 4 mg/hr at 12/10/14 0824  . midazolam (VERSED) bolus via infusion 1 mg  1 mg Intravenous Q30 min PRN Erick Colace, NP      . mupirocin ointment (BACTROBAN) 2 % 1 application  1 application Nasal BID Peter M Martinique, MD   1 application at 33/35/45 2101  . norepinephrine (LEVOPHED) 16 mg in dextrose 5 % 250 mL (0.064 mg/mL) infusion  0-50 mcg/min Intravenous Titrated Peter M Martinique, MD 17.8 mL/hr at 12/10/14 0745 19 mcg/min at 12/10/14 0745  . pantoprazole (PROTONIX) injection 40 mg  40 mg Intravenous QHS Erick Colace, NP   40 mg at 12/09/14 2101  . sodium chloride 0.9 % injection 10-40 mL  10-40 mL Intracatheter Q12H Pixie Casino, MD   10 mL at 12/09/14 2103    . sodium chloride 0.9 % injection 10-40 mL  10-40 mL Intracatheter PRN Pixie Casino, MD      . sodium chloride 0.9 % injection 3 mL  3 mL Intravenous Q12H Peter M Martinique, MD   3 mL at 12/09/14 1028  . sodium chloride 0.9 % injection 3 mL  3 mL Intravenous PRN Peter M Martinique, MD        Physical Exam: General appearance: Sedated, on arctic sun, ventilator Neck: no carotid bruit and no JVD Lungs: diminished breath sounds bilaterally Heart: irregularly irregular rhythm and tachycardic Abdomen: soft, non-distended, hypoactive bowel sounds Extremities: extremities normal, atraumatic, no cyanosis or edema Pulses: 2+ and symmetric Skin: pale, cool Neurologic: Mental status: sedated, intubated on vent .  Lab Results: Results for orders placed or performed during the hospital encounter of 11/20/2014 (from the past 48 hour(s))  CBC     Status: Abnormal   Collection Time: 12/02/2014  4:19 PM  Result Value Ref Range   WBC 9.5 4.0 - 10.5 K/uL   RBC 3.55 (L) 4.22 - 5.81 MIL/uL   Hemoglobin 10.5 (L) 13.0 - 17.0 g/dL   HCT 33.6 (L) 39.0 - 52.0 %   MCV 94.6 78.0 - 100.0 fL   MCH 29.6 26.0 - 34.0 pg   MCHC 31.3 30.0 - 36.0 g/dL   RDW 17.0 (H) 11.5 - 15.5 %   Platelets 294 150 - 400 K/uL  Differential     Status: None   Collection Time: 12/07/2014  4:19 PM  Result Value Ref Range   Neutrophils Relative % 50 43 - 77 %   Lymphocytes Relative 39 12 - 46 %   Monocytes Relative 7 3 - 12 %   Eosinophils Relative 3 0 - 5 %   Basophils Relative 1 0 - 1 %   Neutro Abs 4.7 1.7 - 7.7 K/uL   Lymphs Abs 3.7 0.7 - 4.0 K/uL   Monocytes Absolute 0.7 0.1 - 1.0 K/uL   Eosinophils Absolute 0.3 0.0 - 0.7 K/uL   Basophils Absolute 0.1 0.0 - 0.1 K/uL   RBC Morphology RARE NRBCs    WBC Morphology INCREASED BANDS (>20% BANDS)   Protime-INR     Status: Abnormal   Collection Time: 12/19/2014  4:19 PM  Result Value Ref Range   Prothrombin Time 16.5 (H) 11.6 - 15.2 seconds   INR 1.32 0.00 - 1.49  APTT     Status:  Abnormal   Collection Time: 11/22/2014  4:19 PM  Result Value Ref Range   aPTT 40 (H) 24 - 37 seconds    Comment:        IF BASELINE aPTT IS ELEVATED, SUGGEST PATIENT  RISK ASSESSMENT BE USED TO DETERMINE APPROPRIATE ANTICOAGULANT THERAPY.   Basic metabolic panel     Status: Abnormal   Collection Time: 11/22/2014  4:19 PM  Result Value Ref Range   Sodium 136 (L) 137 - 147 mEq/L   Potassium 3.4 (L) 3.7 - 5.3 mEq/L   Chloride 99 96 - 112 mEq/L   CO2 17 (L) 19 - 32 mEq/L   Glucose, Bld 285 (H) 70 - 99 mg/dL   BUN 25 (H) 6 - 23 mg/dL   Creatinine, Ser 1.34 0.50 - 1.35 mg/dL   Calcium 8.8 8.4 - 10.5 mg/dL   GFR calc non Af Amer 48 (L) >90 mL/min   GFR calc Af Amer 55 (L) >90 mL/min    Comment: (NOTE) The eGFR has been calculated using the CKD EPI equation. This calculation has not been validated in all clinical situations. eGFR's persistently <90 mL/min signify possible Chronic Kidney Disease.    Anion gap 20 (H) 5 - 15  I-stat troponin, ED (do not order at [facility])     Status: Abnormal   Collection Time: 12/19/2014  4:26 PM  Result Value Ref Range   Troponin i, poc 0.15 (HH) 0.00 - 0.08 ng/mL   Comment NOTIFIED PHYSICIAN    Comment 3            Comment: Due to the release kinetics of cTnI, a negative result within the first hours of the onset of symptoms does not rule out myocardial infarction with certainty. If myocardial infarction is still suspected, repeat the test at appropriate intervals.   I-Stat Chem 8, ED     Status: Abnormal   Collection Time: 12/16/2014  4:27 PM  Result Value Ref Range   Sodium 138 137 - 147 mEq/L   Potassium 3.6 (L) 3.7 - 5.3 mEq/L   Chloride 105 96 - 112 mEq/L   BUN 32 (H) 6 - 23 mg/dL   Creatinine, Ser 1.40 (H) 0.50 - 1.35 mg/dL   Glucose, Bld 264 (H) 70 - 99 mg/dL   Calcium, Ion 1.17 1.13 - 1.30 mmol/L   TCO2 16 0 - 100 mmol/L   Hemoglobin 12.2 (L) 13.0 - 17.0 g/dL   HCT 36.0 (L) 39.0 - 52.0 %  Troponin I     Status: Abnormal   Collection Time:  11/26/2014  5:45 PM  Result Value Ref Range   Troponin I 13.79 (HH) <0.30 ng/mL    Comment:        Due to the release kinetics of cTnI, a negative result within the first hours of the onset of symptoms does not rule out myocardial infarction with certainty. If myocardial infarction is still suspected, repeat the test at appropriate intervals. CRITICAL RESULT CALLED TO, READ BACK BY AND VERIFIED WITH: S.STOWE,RN [id number] 12/10/2014 M.CAMPBELL   Basic metabolic panel     Status: Abnormal   Collection Time: 12/10/2014  5:45 PM  Result Value Ref Range   Sodium 137 137 - 147 mEq/L   Potassium 3.9 3.7 - 5.3 mEq/L   Chloride 99 96 - 112 mEq/L   CO2 18 (L) 19 - 32 mEq/L   Glucose, Bld 251 (H) 70 - 99 mg/dL   BUN 29 (H) 6 - 23 mg/dL   Creatinine, Ser 1.46 (H) 0.50 - 1.35 mg/dL   Calcium 8.3 (L) 8.4 - 10.5 mg/dL   GFR calc non Af Amer 43 (L) >90 mL/min   GFR calc Af Amer 50 (L) >90 mL/min    Comment: (NOTE) The  eGFR has been calculated using the CKD EPI equation. This calculation has not been validated in all clinical situations. eGFR's persistently <90 mL/min signify possible Chronic Kidney Disease.    Anion gap 20 (H) 5 - 15  Protime-INR now and repeat in 8 hours     Status: Abnormal   Collection Time: 12/14/2014  5:45 PM  Result Value Ref Range   Prothrombin Time 16.1 (H) 11.6 - 15.2 seconds   INR 1.28 0.00 - 1.49  APTT now and repeat in 8 hours     Status: Abnormal   Collection Time: 11/30/2014  5:45 PM  Result Value Ref Range   aPTT 128 (H) 24 - 37 seconds    Comment:        IF BASELINE aPTT IS ELEVATED, SUGGEST PATIENT RISK ASSESSMENT BE USED TO DETERMINE APPROPRIATE ANTICOAGULANT THERAPY.   Glucose, capillary     Status: Abnormal   Collection Time: 12/11/2014  6:29 PM  Result Value Ref Range   Glucose-Capillary 233 (H) 70 - 99 mg/dL  Basic metabolic panel     Status: Abnormal   Collection Time: 12/10/2014  6:30 PM  Result Value Ref Range   Sodium 138 137 - 147 mEq/L   Potassium 3.9  3.7 - 5.3 mEq/L   Chloride 100 96 - 112 mEq/L   CO2 18 (L) 19 - 32 mEq/L   Glucose, Bld 255 (H) 70 - 99 mg/dL   BUN 30 (H) 6 - 23 mg/dL   Creatinine, Ser 1.46 (H) 0.50 - 1.35 mg/dL   Calcium 8.5 8.4 - 10.5 mg/dL   GFR calc non Af Amer 43 (L) >90 mL/min   GFR calc Af Amer 50 (L) >90 mL/min    Comment: (NOTE) The eGFR has been calculated using the CKD EPI equation. This calculation has not been validated in all clinical situations. eGFR's persistently <90 mL/min signify possible Chronic Kidney Disease.    Anion gap 20 (H) 5 - 15  Glucose, capillary     Status: Abnormal   Collection Time: 11/23/2014  7:54 PM  Result Value Ref Range   Glucose-Capillary 208 (H) 70 - 99 mg/dL  MRSA PCR Screening     Status: Abnormal   Collection Time: 11/28/2014  8:13 PM  Result Value Ref Range   MRSA by PCR POSITIVE (A) NEGATIVE    Comment:        The GeneXpert MRSA Assay (FDA approved for NASAL specimens only), is one component of a comprehensive MRSA colonization surveillance program. It is not intended to diagnose MRSA infection nor to guide or monitor treatment for MRSA infections. RESULT CALLED TO, READ BACK BY AND VERIFIED WITH: Val Riles RN [id number] 12/15/2014 MITCHELL,L   Glucose, capillary     Status: Abnormal   Collection Time: 12/19/2014  9:01 PM  Result Value Ref Range   Glucose-Capillary 228 (H) 70 - 99 mg/dL  Troponin I     Status: Abnormal   Collection Time: 11/24/2014  9:18 PM  Result Value Ref Range   Troponin I >20.00 (HH) <0.30 ng/mL    Comment:        Due to the release kinetics of cTnI, a negative result within the first hours of the onset of symptoms does not rule out myocardial infarction with certainty. If myocardial infarction is still suspected, repeat the test at appropriate intervals. CRITICAL VALUE NOTED.  VALUE IS CONSISTENT WITH PREVIOUSLY REPORTED AND CALLED VALUE.   Basic metabolic panel     Status: Abnormal   Collection Time:  11/20/2014  9:18 PM  Result Value Ref  Range   Sodium 137 137 - 147 mEq/L   Potassium 3.1 (L) 3.7 - 5.3 mEq/L    Comment: DELTA CHECK NOTED NO VISIBLE HEMOLYSIS    Chloride 100 96 - 112 mEq/L   CO2 21 19 - 32 mEq/L   Glucose, Bld 234 (H) 70 - 99 mg/dL   BUN 32 (H) 6 - 23 mg/dL   Creatinine, Ser 1.49 (H) 0.50 - 1.35 mg/dL   Calcium 8.5 8.4 - 10.5 mg/dL   GFR calc non Af Amer 42 (L) >90 mL/min   GFR calc Af Amer 49 (L) >90 mL/min    Comment: (NOTE) The eGFR has been calculated using the CKD EPI equation. This calculation has not been validated in all clinical situations. eGFR's persistently <90 mL/min signify possible Chronic Kidney Disease.    Anion gap 16 (H) 5 - 15  CBC     Status: Abnormal   Collection Time: 11/21/2014  9:45 PM  Result Value Ref Range   WBC 27.2 (H) 4.0 - 10.5 K/uL   RBC 3.75 (L) 4.22 - 5.81 MIL/uL   Hemoglobin 11.4 (L) 13.0 - 17.0 g/dL   HCT 35.5 (L) 39.0 - 52.0 %   MCV 94.7 78.0 - 100.0 fL   MCH 30.4 26.0 - 34.0 pg   MCHC 32.1 30.0 - 36.0 g/dL   RDW 16.9 (H) 11.5 - 15.5 %   Platelets 428 (H) 150 - 400 K/uL  Glucose, capillary     Status: Abnormal   Collection Time: 12/10/2014 10:52 PM  Result Value Ref Range   Glucose-Capillary 206 (H) 70 - 99 mg/dL   Comment 1 Venous Sample   Basic metabolic panel     Status: Abnormal   Collection Time: 12/01/2014 11:54 PM  Result Value Ref Range   Sodium 138 137 - 147 mEq/L   Potassium 3.5 (L) 3.7 - 5.3 mEq/L   Chloride 102 96 - 112 mEq/L   CO2 20 19 - 32 mEq/L   Glucose, Bld 228 (H) 70 - 99 mg/dL   BUN 34 (H) 6 - 23 mg/dL   Creatinine, Ser 1.48 (H) 0.50 - 1.35 mg/dL   Calcium 8.8 8.4 - 10.5 mg/dL   GFR calc non Af Amer 42 (L) >90 mL/min   GFR calc Af Amer 49 (L) >90 mL/min    Comment: (NOTE) The eGFR has been calculated using the CKD EPI equation. This calculation has not been validated in all clinical situations. eGFR's persistently <90 mL/min signify possible Chronic Kidney Disease.    Anion gap 16 (H) 5 - 15  Glucose, capillary     Status:  Abnormal   Collection Time: 11/20/2014 11:54 PM  Result Value Ref Range   Glucose-Capillary 202 (H) 70 - 99 mg/dL  I-STAT 3, arterial blood gas (G3+)     Status: Abnormal   Collection Time: 12/09/14 12:51 AM  Result Value Ref Range   pH, Arterial 7.320 (L) 7.350 - 7.450   pCO2 arterial 38.2 35.0 - 45.0 mmHg   pO2, Arterial 335.0 (H) 80.0 - 100.0 mmHg   Bicarbonate 20.9 20.0 - 24.0 mEq/L   TCO2 22 0 - 100 mmol/L   O2 Saturation 100.0 %   Acid-base deficit 6.0 (H) 0.0 - 2.0 mmol/L   Patient temperature 32.1 C    Collection site ARTERIAL LINE    Drawn by RT    Sample type ARTERIAL   Glucose, capillary     Status: Abnormal  Collection Time: 12/09/14  1:16 AM  Result Value Ref Range   Glucose-Capillary 186 (H) 70 - 99 mg/dL  Basic metabolic panel     Status: Abnormal   Collection Time: 12/09/14  1:45 AM  Result Value Ref Range   Sodium 141 137 - 147 mEq/L   Potassium 3.9 3.7 - 5.3 mEq/L   Chloride 105 96 - 112 mEq/L   CO2 18 (L) 19 - 32 mEq/L   Glucose, Bld 173 (H) 70 - 99 mg/dL   BUN 38 (H) 6 - 23 mg/dL   Creatinine, Ser 1.51 (H) 0.50 - 1.35 mg/dL   Calcium 8.5 8.4 - 10.5 mg/dL   GFR calc non Af Amer 41 (L) >90 mL/min   GFR calc Af Amer 48 (L) >90 mL/min    Comment: (NOTE) The eGFR has been calculated using the CKD EPI equation. This calculation has not been validated in all clinical situations. eGFR's persistently <90 mL/min signify possible Chronic Kidney Disease.    Anion gap 18 (H) 5 - 15  Protime-INR now and repeat in 8 hours     Status: None   Collection Time: 12/09/14  1:45 AM  Result Value Ref Range   Prothrombin Time 14.8 11.6 - 15.2 seconds   INR 1.14 0.00 - 1.49  APTT now and repeat in 8 hours     Status: None   Collection Time: 12/09/14  1:45 AM  Result Value Ref Range   aPTT 35 24 - 37 seconds  Glucose, capillary     Status: Abnormal   Collection Time: 12/09/14  2:23 AM  Result Value Ref Range   Glucose-Capillary 164 (H) 70 - 99 mg/dL  Glucose, capillary      Status: Abnormal   Collection Time: 12/09/14  3:29 AM  Result Value Ref Range   Glucose-Capillary 142 (H) 70 - 99 mg/dL  Urinalysis, Routine w reflex microscopic     Status: Abnormal   Collection Time: 12/09/14  5:27 AM  Result Value Ref Range   Color, Urine AMBER (A) YELLOW    Comment: BIOCHEMICALS MAY BE AFFECTED BY COLOR   APPearance CLOUDY (A) CLEAR   Specific Gravity, Urine 1.028 1.005 - 1.030   pH 5.0 5.0 - 8.0   Glucose, UA NEGATIVE NEGATIVE mg/dL   Hgb urine dipstick LARGE (A) NEGATIVE   Bilirubin Urine NEGATIVE NEGATIVE   Ketones, ur NEGATIVE NEGATIVE mg/dL   Protein, ur 100 (A) NEGATIVE mg/dL   Urobilinogen, UA 1.0 0.0 - 1.0 mg/dL   Nitrite NEGATIVE NEGATIVE   Leukocytes, UA SMALL (A) NEGATIVE  Clostridium Difficile by PCR     Status: None   Collection Time: 12/09/14  5:27 AM  Result Value Ref Range   C difficile by pcr NEGATIVE NEGATIVE  Urine microscopic-add on     Status: Abnormal   Collection Time: 12/09/14  5:27 AM  Result Value Ref Range   Squamous Epithelial / LPF FEW (A) RARE   WBC, UA 11-20 <3 WBC/hpf   RBC / HPF 21-50 <3 RBC/hpf   Bacteria, UA MANY (A) RARE   Casts GRANULAR CAST (A) NEGATIVE  Troponin I     Status: Abnormal   Collection Time: 12/09/14  5:45 AM  Result Value Ref Range   Troponin I >20.00 (HH) <0.30 ng/mL    Comment:        Due to the release kinetics of cTnI, a negative result within the first hours of the onset of symptoms does not rule out myocardial infarction with  certainty. If myocardial infarction is still suspected, repeat the test at appropriate intervals. CRITICAL VALUE NOTED.  VALUE IS CONSISTENT WITH PREVIOUSLY REPORTED AND CALLED VALUE.   Basic metabolic panel     Status: Abnormal   Collection Time: 12/09/14  5:45 AM  Result Value Ref Range   Sodium 142 137 - 147 mEq/L   Potassium 3.6 (L) 3.7 - 5.3 mEq/L   Chloride 107 96 - 112 mEq/L   CO2 17 (L) 19 - 32 mEq/L   Glucose, Bld 135 (H) 70 - 99 mg/dL   BUN 38 (H) 6 -  23 mg/dL   Creatinine, Ser 1.47 (H) 0.50 - 1.35 mg/dL   Calcium 8.4 8.4 - 10.5 mg/dL   GFR calc non Af Amer 43 (L) >90 mL/min   GFR calc Af Amer 49 (L) >90 mL/min    Comment: (NOTE) The eGFR has been calculated using the CKD EPI equation. This calculation has not been validated in all clinical situations. eGFR's persistently <90 mL/min signify possible Chronic Kidney Disease.    Anion gap 18 (H) 5 - 15  Glucose, capillary     Status: Abnormal   Collection Time: 12/09/14  5:50 AM  Result Value Ref Range   Glucose-Capillary 128 (H) 70 - 99 mg/dL  Glucose, capillary     Status: Abnormal   Collection Time: 12/09/14  8:08 AM  Result Value Ref Range   Glucose-Capillary 115 (H) 70 - 99 mg/dL   Comment 1 Venous Sample   Glucose, capillary     Status: Abnormal   Collection Time: 12/09/14 10:19 AM  Result Value Ref Range   Glucose-Capillary 120 (H) 70 - 99 mg/dL   Comment 1 Arterial Sample   I-STAT, chem 8     Status: Abnormal   Collection Time: 12/09/14 10:24 AM  Result Value Ref Range   Sodium 142 137 - 147 mEq/L   Potassium 3.7 3.7 - 5.3 mEq/L   Chloride 110 96 - 112 mEq/L   BUN 37 (H) 6 - 23 mg/dL   Creatinine, Ser 1.60 (H) 0.50 - 1.35 mg/dL   Glucose, Bld 140 (H) 70 - 99 mg/dL   Calcium, Ion 1.18 1.13 - 1.30 mmol/L   TCO2 17 0 - 100 mmol/L   Hemoglobin 13.3 13.0 - 17.0 g/dL   HCT 39.0 39.0 - 52.0 %  Occult blood card to lab, stool RN will collect     Status: None   Collection Time: 12/09/14 11:00 AM  Result Value Ref Range   Fecal Occult Bld NEGATIVE NEGATIVE  Troponin I     Status: Abnormal   Collection Time: 12/09/14 11:45 AM  Result Value Ref Range   Troponin I >20.00 (HH) <0.30 ng/mL    Comment:        Due to the release kinetics of cTnI, a negative result within the first hours of the onset of symptoms does not rule out myocardial infarction with certainty. If myocardial infarction is still suspected, repeat the test at appropriate intervals. CRITICAL VALUE  NOTED.  VALUE IS CONSISTENT WITH PREVIOUSLY REPORTED AND CALLED VALUE.   Glucose, capillary     Status: Abnormal   Collection Time: 12/09/14 12:18 PM  Result Value Ref Range   Glucose-Capillary 113 (H) 70 - 99 mg/dL   Comment 1 Arterial Sample   Glucose, capillary     Status: Abnormal   Collection Time: 12/09/14  2:03 PM  Result Value Ref Range   Glucose-Capillary 117 (H) 70 - 99 mg/dL  Comment 1 Arterial Sample   I-STAT, chem 8     Status: Abnormal   Collection Time: 12/09/14  2:08 PM  Result Value Ref Range   Sodium 139 137 - 147 mEq/L   Potassium 3.8 3.7 - 5.3 mEq/L   Chloride 116 (H) 96 - 112 mEq/L   BUN 34 (H) 6 - 23 mg/dL   Creatinine, Ser 1.60 (H) 0.50 - 1.35 mg/dL   Glucose, Bld 134 (H) 70 - 99 mg/dL   Calcium, Ion 1.10 (L) 1.13 - 1.30 mmol/L   TCO2 17 0 - 100 mmol/L   Hemoglobin 11.9 (L) 13.0 - 17.0 g/dL   HCT 35.0 (L) 39.0 - 52.0 %  Glucose, capillary     Status: Abnormal   Collection Time: 12/09/14  4:23 PM  Result Value Ref Range   Glucose-Capillary 120 (H) 70 - 99 mg/dL   Comment 1 Arterial Sample   Glucose, capillary     Status: Abnormal   Collection Time: 12/09/14  5:38 PM  Result Value Ref Range   Glucose-Capillary 127 (H) 70 - 99 mg/dL   Comment 1 Arterial Sample   I-STAT, chem 8     Status: Abnormal   Collection Time: 12/09/14  5:41 PM  Result Value Ref Range   Sodium 137 137 - 147 mEq/L   Potassium 3.8 3.7 - 5.3 mEq/L   Chloride 118 (H) 96 - 112 mEq/L   BUN 34 (H) 6 - 23 mg/dL   Creatinine, Ser 1.60 (H) 0.50 - 1.35 mg/dL   Glucose, Bld 142 (H) 70 - 99 mg/dL   Calcium, Ion 1.10 (L) 1.13 - 1.30 mmol/L   TCO2 18 0 - 100 mmol/L   Hemoglobin 11.9 (L) 13.0 - 17.0 g/dL   HCT 35.0 (L) 39.0 - 52.0 %  Heparin level (unfractionated)     Status: Abnormal   Collection Time: 12/09/14  5:45 PM  Result Value Ref Range   Heparin Unfractionated 0.12 (L) 0.30 - 0.70 IU/mL    Comment:        IF HEPARIN RESULTS ARE BELOW EXPECTED VALUES, AND PATIENT DOSAGE HAS  BEEN CONFIRMED, SUGGEST FOLLOW UP TESTING OF ANTITHROMBIN III LEVELS.   Glucose, capillary     Status: Abnormal   Collection Time: 12/09/14  8:13 PM  Result Value Ref Range   Glucose-Capillary 124 (H) 70 - 99 mg/dL   Comment 1 Venous Sample   Basic metabolic panel     Status: Abnormal   Collection Time: 12/09/14  9:45 PM  Result Value Ref Range   Sodium 138 137 - 147 mEq/L   Potassium 4.1 3.7 - 5.3 mEq/L   Chloride 105 96 - 112 mEq/L    Comment: DELTA CHECK NOTED   CO2 18 (L) 19 - 32 mEq/L   Glucose, Bld 138 (H) 70 - 99 mg/dL   BUN 37 (H) 6 - 23 mg/dL   Creatinine, Ser 1.51 (H) 0.50 - 1.35 mg/dL   Calcium 8.3 (L) 8.4 - 10.5 mg/dL   GFR calc non Af Amer 41 (L) >90 mL/min   GFR calc Af Amer 48 (L) >90 mL/min    Comment: (NOTE) The eGFR has been calculated using the CKD EPI equation. This calculation has not been validated in all clinical situations. eGFR's persistently <90 mL/min signify possible Chronic Kidney Disease.    Anion gap 15 5 - 15  Glucose, capillary     Status: Abnormal   Collection Time: 12/10/14 12:13 AM  Result Value Ref Range  Glucose-Capillary 133 (H) 70 - 99 mg/dL   Comment 1 Venous Sample   Basic metabolic panel     Status: Abnormal   Collection Time: 12/10/14  2:19 AM  Result Value Ref Range   Sodium 140 137 - 147 mEq/L   Potassium 3.9 3.7 - 5.3 mEq/L   Chloride 105 96 - 112 mEq/L   CO2 19 19 - 32 mEq/L   Glucose, Bld 131 (H) 70 - 99 mg/dL   BUN 37 (H) 6 - 23 mg/dL   Creatinine, Ser 1.51 (H) 0.50 - 1.35 mg/dL   Calcium 8.3 (L) 8.4 - 10.5 mg/dL   GFR calc non Af Amer 41 (L) >90 mL/min   GFR calc Af Amer 48 (L) >90 mL/min    Comment: (NOTE) The eGFR has been calculated using the CKD EPI equation. This calculation has not been validated in all clinical situations. eGFR's persistently <90 mL/min signify possible Chronic Kidney Disease.    Anion gap 16 (H) 5 - 15  Magnesium     Status: None   Collection Time: 12/10/14  2:19 AM  Result Value Ref  Range   Magnesium 2.2 1.5 - 2.5 mg/dL  Basic metabolic panel     Status: Abnormal   Collection Time: 12/10/14  4:00 AM  Result Value Ref Range   Sodium 141 137 - 147 mEq/L   Potassium 4.0 3.7 - 5.3 mEq/L   Chloride 107 96 - 112 mEq/L   CO2 19 19 - 32 mEq/L   Glucose, Bld 137 (H) 70 - 99 mg/dL   BUN 36 (H) 6 - 23 mg/dL   Creatinine, Ser 1.56 (H) 0.50 - 1.35 mg/dL   Calcium 8.6 8.4 - 10.5 mg/dL   GFR calc non Af Amer 40 (L) >90 mL/min   GFR calc Af Amer 46 (L) >90 mL/min    Comment: (NOTE) The eGFR has been calculated using the CKD EPI equation. This calculation has not been validated in all clinical situations. eGFR's persistently <90 mL/min signify possible Chronic Kidney Disease.    Anion gap 15 5 - 15  CBC     Status: Abnormal   Collection Time: 12/10/14  4:00 AM  Result Value Ref Range   WBC 27.3 (H) 4.0 - 10.5 K/uL   RBC 3.96 (L) 4.22 - 5.81 MIL/uL   Hemoglobin 12.2 (L) 13.0 - 17.0 g/dL   HCT 35.7 (L) 39.0 - 52.0 %   MCV 90.2 78.0 - 100.0 fL   MCH 30.8 26.0 - 34.0 pg   MCHC 34.2 30.0 - 36.0 g/dL   RDW 16.5 (H) 11.5 - 15.5 %   Platelets 384 150 - 400 K/uL  Heparin level (unfractionated)     Status: None   Collection Time: 12/10/14  4:00 AM  Result Value Ref Range   Heparin Unfractionated 0.59 0.30 - 0.70 IU/mL    Comment:        IF HEPARIN RESULTS ARE BELOW EXPECTED VALUES, AND PATIENT DOSAGE HAS BEEN CONFIRMED, SUGGEST FOLLOW UP TESTING OF ANTITHROMBIN III LEVELS.   Glucose, capillary     Status: Abnormal   Collection Time: 12/10/14  4:09 AM  Result Value Ref Range   Glucose-Capillary 135 (H) 70 - 99 mg/dL   Comment 1 Venous Sample   Basic metabolic panel     Status: Abnormal   Collection Time: 12/10/14  7:20 AM  Result Value Ref Range   Sodium 142 137 - 147 mEq/L   Potassium 4.2 3.7 - 5.3 mEq/L  Chloride 108 96 - 112 mEq/L   CO2 19 19 - 32 mEq/L   Glucose, Bld 123 (H) 70 - 99 mg/dL   BUN 37 (H) 6 - 23 mg/dL   Creatinine, Ser 1.59 (H) 0.50 - 1.35 mg/dL     Calcium 8.3 (L) 8.4 - 10.5 mg/dL   GFR calc non Af Amer 39 (L) >90 mL/min   GFR calc Af Amer 45 (L) >90 mL/min    Comment: (NOTE) The eGFR has been calculated using the CKD EPI equation. This calculation has not been validated in all clinical situations. eGFR's persistently <90 mL/min signify possible Chronic Kidney Disease.    Anion gap 15 5 - 15    Imaging: Dg Chest Port 1 View  12/10/2014   CLINICAL DATA:  Acute respiratory failure intubated  EXAM: PORTABLE CHEST - 1 VIEW  COMPARISON:  12/09/2014  FINDINGS: Endotracheal tube and NG tube are unchanged. Central venous line is unchanged. Stable cardiac silhouette. There is mild basilar atelectasis. No pulmonary edema or infiltrate. No pneumothorax.  IMPRESSION: 1. Stable support apparatus. 2. Mild basilar atelectasis. 3. No change from prior.   Electronically Signed   By: Suzy Bouchard M.D.   On: 12/10/2014 07:13   Dg Chest Port 1 View  12/09/2014   CLINICAL DATA:  Difficulty breathing/hypoxia  EXAM: PORTABLE CHEST - 1 VIEW  COMPARISON:  December 08, 2014  FINDINGS: Endotracheal tube tip is 5.7 cm above the carina. Central catheter tip is in the superior vena cava. Nasogastric tube tip and side port in stomach. No pneumothorax. There is a focal area of left base atelectasis. Elsewhere lungs are clear. Heart is upper normal in size with pulmonary vascularity within normal limits. No adenopathy. No bone lesions.  IMPRESSION: Tube and catheter positions as described without pneumothorax. Atelectasis left base. This finding may represent early pneumonia. Elsewhere lungs clear. No change in cardiac silhouette.   Electronically Signed   By: Lowella Grip M.D.   On: 12/09/2014 07:01   Dg Chest Port 1 View  12/07/2014   CLINICAL DATA:  Respiratory failure.  Intubated.  EXAM: PORTABLE CHEST - 1 VIEW  COMPARISON:  Portable film earlier today.  FINDINGS: Endotracheal tube has been advanced and now lies 16 mm above the carina. The heart remains  mildly enlarged. No focal lung opacities. RIGHT IJ catheter tip cavoatrial junction. No pneumothorax.  IMPRESSION: Satisfactory ETT placement.  No active cardiopulmonary disease.   Electronically Signed   By: Rolla Flatten M.D.   On: 12/05/2014 19:37   Dg Chest Portable 1 View  12/14/2014   CLINICAL DATA:  Difficult intubation  EXAM: PORTABLE CHEST - 1 VIEW  COMPARISON:  12/04/2014  FINDINGS: Endotracheal tube at the thoracic inlet.  Cardiomegaly with pulmonary vascular congestion. Increased interstitial markings, favored to be chronic. Mild interstitial edema is possible. No pleural effusion or pneumothorax.  Right IJ venous catheter terminates in the lower SVC.  IMPRESSION: Endotracheal tube at the thoracic inlet.  Right IJ venous catheter terminates in the lower SVC. No pneumothorax.   Electronically Signed   By: Julian Hy M.D.   On: 12/15/2014 17:41   Dg Chest Portable 1 View  12/07/2014   CLINICAL DATA:  Cardiac arrest.  EXAM: PORTABLE CHEST - 1 VIEW  COMPARISON:  None.  FINDINGS: Cardiomegaly. Mild vascular congestion. Telemetry leads overlie the chest. Prior cervical fusion. No effusion or pneumothorax.  IMPRESSION: Cardiomegaly with mild vascular congestion.   Electronically Signed   By: Michae Kava.D.  On: 12/16/2014 17:17   Dg Abd Portable 1v  12/16/2014   CLINICAL DATA:  Nasogastric tube placement  EXAM: PORTABLE ABDOMEN - 1 VIEW  COMPARISON:  None.  FINDINGS: Nasogastric tube tip lies in the stomach. Visualized bowel gas unremarkable.  IMPRESSION: NG tube tip in the stomach.   Electronically Signed   By: Rolla Flatten M.D.   On: 11/28/2014 19:38    Assessment:  Active Problems:   Cardiac arrest   Acute respiratory failure   Atrial fibrillation with rapid ventricular response   Pneumonia   Plan:  1. Continues in afib - reasonable rate control. Plan 2D echo today. Rewarming. Once rewarmed, try to wean levophed. When off pressors, will add medications for rate control.  Neurologic prognosis is still uncertain.  Time Spent Directly with Patient:  15 minutes  Length of Stay:  LOS: 2 days   Pixie Casino, MD, [facility] Attending Cardiologist [facility]  HILTY,Kenneth C 12/10/2014, 8:47 AM

## 2014-12-10 NOTE — Progress Notes (Signed)
PULMONARY / CRITICAL CARE MEDICINE   Name: [name] MRN: [id number] DOB: September 22, 1930    ADMISSION DATE:  12/03/2014 CONSULTATION DATE:  12/19  REFERRING MD :  Leanne Lovely   CHIEF COMPLAINT:  Cardiopulmonary arrest   INITIAL PRESENTATION:  This is a 80 year old male. Only known hx on admit was prior cardiac hx w/ EF in 54s by [facility]. Was visiting his wife who is a patient at [facility] on 12/19. Was riding in car on way home  when slumped over and became unresponsive. EMS response time unclear. On arrival was in VF. Underwent ACLS estimated at ~20 minutes to ROSC. PCCM asked to admit s/p arrest   STUDIES:  Cardiac cath 12/19>>>  SIGNIFICANT EVENTS: 12/19 cardiac arrest. Estimated down time ~ 20 mintues 12/19: cath lab>>> 12/19 hypothermia started.   SUBJECTIVE:  Sedated, paralyzed, at goal temp Norepi up to 20, increasing with warming.  VITAL SIGNS: Temp:  [90 F (32.2 C)-97.3 F (36.3 C)] 97.3 F (36.3 C) (12/21 0900) Pulse Rate:  [51-128] 128 (12/21 0741) Resp:  [10-21] 16 (12/21 0900) BP: (115-133)/(62-77) 133/65 mmHg (12/21 0741) SpO2:  [98 %-100 %] 100 % (12/21 0900) Arterial Line BP: (100-137)/(58-78) 126/64 mmHg (12/21 0900) FiO2 (%):  [40 %] 40 % (12/21 0800)  HEMODYNAMICS: CVP:  [0 mmHg-6 mmHg] 2 mmHg  VENTILATOR SETTINGS: Vent Mode:  [-] PRVC FiO2 (%):  [40 %] 40 % Set Rate:  [14 bmp] 14 bmp Vt Set:  [650 mL] 650 mL PEEP:  [5 cmH20] 5 cmH20 Plateau Pressure:  [14 cmH20-16 cmH20] 16 cmH20   INTAKE / OUTPUT:  Intake/Output Summary (Last 24 hours) at 12/10/14 0944 Last data filed at 12/10/14 0900  Gross per 24 hour  Intake 2419.17 ml  Output    725 ml  Net 1694.17 ml   PHYSICAL EXAMINATION: General:  Elderly 80 year old male, unresponsive on full vent support  Neuro: Sedated, paralyzed HEENT:  Edentulous, now orally intubated   Cardiovascular:  Reg, no MRG  Lungs:  Scattered rhonchi  Abdomen:  Soft, non-tender, + bowel sounds, old mid abd scar  healed  Musculoskeletal:  Intact  Skin:  Cool and mottled   LABS:  CBC  Recent Labs Lab 12/19/2014 1619  11/24/2014 2145  12/09/14 1408 12/09/14 1741 12/10/14 0400  WBC 9.5  --  27.2*  --   --   --  27.3*  HGB 10.5*  < > 11.4*  < > 11.9* 11.9* 12.2*  HCT 33.6*  < > 35.5*  < > 35.0* 35.0* 35.7*  PLT 294  --  428*  --   --   --  384  < > = values in this interval not displayed. Coag's  Recent Labs Lab 11/30/2014 1619 12/02/2014 1745 12/09/14 0145  APTT 40* 128* 35  INR 1.32 1.28 1.14   BMET  Recent Labs Lab 12/10/14 0219 12/10/14 0400 12/10/14 0720  NA 140 141 142  K 3.9 4.0 4.2  CL 105 107 108  CO2 19 19 19   BUN 37* 36* 37*  CREATININE 1.51* 1.56* 1.59*  GLUCOSE 131* 137* 123*   Electrolytes  Recent Labs Lab 12/10/14 0219 12/10/14 0400 12/10/14 0720  CALCIUM 8.3* 8.6 8.3*  MG 2.2  --   --    Sepsis Markers No results for input(s): LATICACIDVEN, PROCALCITON, O2SATVEN in the last 168 hours. ABG  Recent Labs Lab 12/09/14 0051  PHART 7.320*  PCO2ART 38.2  PO2ART 335.0*   Liver Enzymes No results for input(s):  AST, ALT, ALKPHOS, BILITOT, ALBUMIN in the last 168 hours. Cardiac Enzymes  Recent Labs Lab 12/10/2014 2118 12/09/14 0545 12/09/14 1145  TROPONINI >20.00* >20.00* >20.00*   Glucose  Recent Labs Lab 12/09/14 1403 12/09/14 1623 12/09/14 1738 12/09/14 2013 12/10/14 0013 12/10/14 0409  GLUCAP 117* 120* 127* 124* 133* 135*   Imaging Dg Chest Port 1 View  12/09/2014   CLINICAL DATA:  Difficulty breathing/hypoxia  EXAM: PORTABLE CHEST - 1 VIEW  COMPARISON:  December 08, 2014  FINDINGS: Endotracheal tube tip is 5.7 cm above the carina. Central catheter tip is in the superior vena cava. Nasogastric tube tip and side port in stomach. No pneumothorax. There is a focal area of left base atelectasis. Elsewhere lungs are clear. Heart is upper normal in size with pulmonary vascularity within normal limits. No adenopathy. No bone lesions.  IMPRESSION:  Tube and catheter positions as described without pneumothorax. Atelectasis left base. This finding may represent early pneumonia. Elsewhere lungs clear. No change in cardiac silhouette.   Electronically Signed   By: Lowella Grip M.D.   On: 12/09/2014 07:01   ASSESSMENT / PLAN:  PULMONARY OETT 12/19>>> A: acute respiratory failure s/p cardiopulmonary arrest.  Pulmonary infiltrates-->improved w/ positive pressure  P:   - Full vent support until paralytics are off and neuro status is declared. - Titrate O2 down as able. - CXR and ABG in AM.  CARDIOVASCULAR CVL right IJ CVL 12/19>>> A:  VT arrest Cardiogenic shock P:  No intervention made on L heart cath until neuro status is declared. If BP continues to drop then will add neosyn.  RENAL A:   AKI w/ + anion gap metabolic acidosis s/p cardiac arrest, S Cr stable 12/20 P:   IV hydration Renal dose meds Strict I&O F/u chem Replace electrolytes as indicated.  GASTROINTESTINAL A:  Prior surgical hx/ unknown Diarrhea P:   SUP w/ PPI C diff screen was sent 12/19 negative  HEMATOLOGIC A:  Anemia of unclear chronicity   P:  Trend cbc Defer heparin to cards   INFECTIOUS A:  R/o aspiration  P:   Sputum 12/19>>NTD Abx: Unasyn 12/19>>> MRSA nasal positive  CXR clearing, possible LLL infiltrate, continue unasyn  ENDOCRINE A:  Hyperglycemia  P:   ssi   NEUROLOGIC A:   Acute encephalopathy  Worried about HIE. Down-time ~20 minutes  P:   RASS goal: -4 Hypothermia protocol: will get NMB and fentanyl gtt  Assess MS on rewarming, then discuss prognosis and goals w family  FAMILY  - Updates: No family bedside 12/21.  - Inter-disciplinary family meet or Palliative Care meeting due by:  12/27  TODAY'S SUMMARY: VF arrest. No intervention indicated on L cath. Continue hypothermia protocol, amiodarone. Assess MS and resp status after warmed  The patient is critically ill with multiple organ systems failure and  requires high complexity decision making for assessment and support, frequent evaluation and titration of therapies, application of advanced monitoring technologies and extensive interpretation of multiple databases.   Critical Care Time devoted to patient care services described in this note is  35  Minutes. This time reflects time of care of this signee Dr Jennet Maduro. This critical care time does not reflect procedure time, or teaching time or supervisory time of PA/NP/Med student/Med Resident etc but could involve care discussion time.  Rush Farmer, M.D. [facility] Pulmonary/Critical Care Medicine. Pager: [phone]. After hours pager: [phone].

## 2014-12-10 NOTE — Progress Notes (Signed)
On call Cardiology, Dr. Kennith Center made of aware of pt HR increasing 120-130's sustaining appearing to be afib. Advised to decrease levo if able to see if BP and HR will improve. Informed pt was on hypothermia protocol and MAP goal is 80. Had to increase levo multiple times to obtain goal. MD is ok with HR 120-130's if sustains in 140-150's advised to call. Will continue to monitor.

## 2014-12-11 ENCOUNTER — Inpatient Hospital Stay (HOSPITAL_COMMUNITY): Payer: Medicare Other

## 2014-12-11 DIAGNOSIS — I7121 Aneurysm of the ascending aorta, without rupture: Secondary | ICD-10-CM | POA: Diagnosis present

## 2014-12-11 DIAGNOSIS — R40244 Other coma, without documented Glasgow coma scale score, or with partial score reported: Secondary | ICD-10-CM

## 2014-12-11 DIAGNOSIS — N179 Acute kidney failure, unspecified: Secondary | ICD-10-CM | POA: Diagnosis present

## 2014-12-11 DIAGNOSIS — R4020 Unspecified coma: Secondary | ICD-10-CM | POA: Diagnosis present

## 2014-12-11 DIAGNOSIS — R40243 Glasgow coma scale score 3-8: Secondary | ICD-10-CM

## 2014-12-11 DIAGNOSIS — I712 Thoracic aortic aneurysm, without rupture: Secondary | ICD-10-CM | POA: Diagnosis present

## 2014-12-11 LAB — POCT I-STAT 3, ART BLOOD GAS (G3+)
Acid-base deficit: 6 mmol/L — ABNORMAL HIGH (ref 0.0–2.0)
Bicarbonate: 19.5 mEq/L — ABNORMAL LOW (ref 20.0–24.0)
O2 SAT: 99 %
Patient temperature: 37.1
TCO2: 21 mmol/L (ref 0–100)
pCO2 arterial: 37 mmHg (ref 35.0–45.0)
pH, Arterial: 7.331 — ABNORMAL LOW (ref 7.350–7.450)
pO2, Arterial: 136 mmHg — ABNORMAL HIGH (ref 80.0–100.0)

## 2014-12-11 LAB — MAGNESIUM: Magnesium: 2.2 mg/dL (ref 1.5–2.5)

## 2014-12-11 LAB — CBC
HCT: 31.8 % — ABNORMAL LOW (ref 39.0–52.0)
Hemoglobin: 10.4 g/dL — ABNORMAL LOW (ref 13.0–17.0)
MCH: 29.4 pg (ref 26.0–34.0)
MCHC: 32.7 g/dL (ref 30.0–36.0)
MCV: 89.8 fL (ref 78.0–100.0)
PLATELETS: 277 10*3/uL (ref 150–400)
RBC: 3.54 MIL/uL — ABNORMAL LOW (ref 4.22–5.81)
RDW: 17.1 % — ABNORMAL HIGH (ref 11.5–15.5)
WBC: 15.6 10*3/uL — AB (ref 4.0–10.5)

## 2014-12-11 LAB — GLUCOSE, CAPILLARY
GLUCOSE-CAPILLARY: 107 mg/dL — AB (ref 70–99)
Glucose-Capillary: 112 mg/dL — ABNORMAL HIGH (ref 70–99)
Glucose-Capillary: 126 mg/dL — ABNORMAL HIGH (ref 70–99)

## 2014-12-11 LAB — BASIC METABOLIC PANEL
Anion gap: 6 (ref 5–15)
BUN: 37 mg/dL — AB (ref 6–23)
CHLORIDE: 112 meq/L (ref 96–112)
CO2: 21 mmol/L (ref 19–32)
Calcium: 8.2 mg/dL — ABNORMAL LOW (ref 8.4–10.5)
Creatinine, Ser: 2.33 mg/dL — ABNORMAL HIGH (ref 0.50–1.35)
GFR calc Af Amer: 28 mL/min — ABNORMAL LOW (ref >90)
GFR calc non Af Amer: 24 mL/min — ABNORMAL LOW (ref >90)
GLUCOSE: 129 mg/dL — AB (ref 70–99)
POTASSIUM: 4.2 mmol/L (ref 3.5–5.1)
Sodium: 139 mmol/L (ref 135–145)

## 2014-12-11 LAB — PHOSPHORUS: PHOSPHORUS: 4.9 mg/dL — AB (ref 2.3–4.6)

## 2014-12-11 LAB — HEPARIN LEVEL (UNFRACTIONATED): HEPARIN UNFRACTIONATED: 0.31 [IU]/mL (ref 0.30–0.70)

## 2014-12-11 MED ORDER — HEPARIN (PORCINE) IN NACL 100-0.45 UNIT/ML-% IJ SOLN
1000.0000 [IU]/h | INTRAMUSCULAR | Status: DC
  Filled 2014-12-11: qty 250

## 2014-12-11 MED ORDER — MORPHINE SULFATE 25 MG/ML IV SOLN
10.0000 mg/h | INTRAVENOUS | Status: DC
  Administered 2014-12-11 – 2014-12-16 (×6): 10 mg/h via INTRAVENOUS
  Administered 2014-12-17 – 2014-12-18 (×2): 12.5 mg/h via INTRAVENOUS
  Filled 2014-12-11 (×8): qty 10

## 2014-12-11 MED ORDER — PANTOPRAZOLE SODIUM 40 MG PO PACK
40.0000 mg | PACK | Freq: Every day | ORAL | Status: DC
  Filled 2014-12-11: qty 20

## 2014-12-11 MED ORDER — SODIUM CHLORIDE 0.9 % IV SOLN
1.5000 g | Freq: Two times a day (BID) | INTRAVENOUS | Status: DC
  Filled 2014-12-11 (×2): qty 1.5

## 2014-12-11 MED ORDER — ONDANSETRON HCL 4 MG/2ML IJ SOLN: 4.0000 mg | Freq: Three times a day (TID) | INTRAMUSCULAR | Status: DC | PRN

## 2014-12-11 MED ORDER — MORPHINE BOLUS VIA INFUSION
5.0000 mg | INTRAVENOUS | Status: DC | PRN
  Filled 2014-12-11: qty 20

## 2014-12-11 NOTE — Procedures (Signed)
Extubation Procedure Note  Patient Details:   Name: CHACE KLIPPEL DOB: 1930-11-12 MRN: [id number]   Airway Documentation:  Airway 8 mm (Active)  Secured at (cm) 24 cm 12/11/2014 12:24 PM  Measured From Lips 12/11/2014 12:24 PM  Secured Location Center 12/11/2014 12:24 PM  Secured By [employer] 12/11/2014 12:24 PM  Tube Holder Repositioned Yes 12/11/2014 12:24 PM  Cuff Pressure (cm H2O) 25 cm H2O 12/09/2014  7:12 PM  Site Condition Dry 12/11/2014 12:24 PM    Evaluation  O2 sats: stable throughout Complications: No apparent complications Patient did tolerate procedure well. Bilateral Breath Sounds: Clear, Diminished Suctioning: Airway No  Pt was extubated per MD order of withdrawal care. Pt was placed on 2 LPM nasal cannula. Positive cuff leak. No stridor noted. Did not have a cough or able to speak name. RN and family at bedside. Morphine started by RN prior to extubation for comfort. SPO2 96% RR 12.   Brody Kump M 12/11/2014, 1:39 PM

## 2014-12-11 NOTE — Progress Notes (Signed)
ANTICOAGULATION CONSULT NOTE - Follow Up Consult ANTIBIOTIC CONSULT NOTE - Follow up [place] for Heparin and Unasyn Indication: atrial fibrillation and aspiration pneumonia  No Known Allergies  Patient Measurements: Height: 6\' 2"  (188 cm) Weight: 199 lb 1.2 oz (90.3 kg) IBW/kg (Calculated) : 82.2 Heparin Dosing Weight: 80kg  Vital Signs: Temp: 99 F (37.2 C) (12/22 0800) Temp Source: Core (Comment) (12/22 0800) Pulse Rate: 75 (12/22 1224)  Labs:  Recent Labs  11/24/2014 1619  12/06/2014 1745  12/09/2014 2118 11/25/2014 2145  12/09/14 0145 12/09/14 0545  12/09/14 1145  12/09/14 1741  12/10/14 0400 12/10/14 0720 12/10/14 1030 12/11/14 0420  HGB 10.5*  < >  --   --   --  11.4*  --   --   --   < >  --   < > 11.9*  --  12.2*  --   --  10.4*  HCT 33.6*  < >  --   --   --  35.5*  --   --   --   < >  --   < > 35.0*  --  35.7*  --   --  31.8*  PLT 294  --   --   --   --  428*  --   --   --   --   --   --   --   --  384  --   --  277  APTT 40*  --  128*  --   --   --   --  35  --   --   --   --   --   --   --   --   --   --   LABPROT 16.5*  --  16.1*  --   --   --   --  14.8  --   --   --   --   --   --   --   --   --   --   INR 1.32  --  1.28  --   --   --   --  1.14  --   --   --   --   --   --   --   --   --   --   HEPARINUNFRC  --   --   --   --   --   --   --   --   --   --   --   --   --   < > 0.59  --  0.47 0.31  CREATININE 1.34  < > 1.46*  < > 1.49*  --   < > 1.51* 1.47*  < >  --   < > 1.60*  < > 1.56* 1.59*  --  2.33*  TROPONINI  --   < > 13.79*  --  >20.00*  --   --   --  >20.00*  --  >20.00*  --   --   --   --   --   --   --   < > = values in this interval not displayed.  Estimated Creatinine Clearance: 28.4 mL/min (by C-G formula based on Cr of 2.33).   Medications:  Heparin @ 900 units/hr  Assessment: 84yom started on IV heparin for new onset afib. Was undergoing hypothermia protocol, but rewarming completed at ~10 am yesterday. Heparin level is  therapeutic on the low end  and trending down. Drop in Hgb 12.2-->10.4 and platelets 384-->277 noted, will continue to watch. No bleeding reported.  Also continues on day #4 Unasyn for aspiration pneumonia. His renal function is worsening with sCr increased from 1.56 to 2.33. Will adjust unasyn dose.   12/19 unasyn>> 12/20 cdiff>>negative  Goal of Therapy:  Heparin level 0.3-0.7 units/ml Monitor platelets by anticoagulation protocol: Yes   Plan:  1) Increase heparin to 1000 units/hr 2) Daily heparin level and CBC 3) Change unasyn to 1.5g IV q12  Deboraha Sprang 12/11/2014,1:31 PM

## 2014-12-11 NOTE — Progress Notes (Signed)
   12/11/14 1600  Clinical Encounter Type  Visited With Patient and family together;Health care provider  Visit Type Initial;Spiritual support;Social support;Patient actively dying  Referral From Physician  Spiritual Encounters  Spiritual Needs Grief support  Stress Factors  Family Stress Factors Loss   Chaplain was referred to patient via spiritual care consult. Patient was taken off life support earlier today and is currently actively dying. When chaplain arrived patient's son, daughter-in-law, and a few grandchildren were present. Patient's family was quietly watching over patient. Chaplain spoke with family briefly and let them know that chaplain support would be available throughout the evening if they needed it. Page Elodia Florence chaplain if patient's family needs support throughout the evening/night. Gar Ponto, Chaplain  4:42 PM

## 2014-12-11 NOTE — Care Management Note (Signed)
    Page 1 of 1   12/11/2014     11:27:07 AM CARE MANAGEMENT NOTE 12/11/2014  Patient:  [name], [name]   Account Number:  [id number]  Date Initiated:  12/11/2014  Documentation initiated by:  Elissa Hefty  Subjective/Objective Assessment:   adm w resp distress, vent     Action/Plan:   lives w wife who is in hosp also at present. pcp dr m skains   Anticipated DC Date:     Anticipated DC Plan:           Choice offered to / List presented to:             Status of service:   Medicare Important Message given?  YES (If response is "NO", the following Medicare IM given date fields will be blank) Date Medicare IM given:  12/11/2014 Medicare IM given by:  Elissa Hefty Date Additional Medicare IM given:   Additional Medicare IM given by:    Discharge Disposition:    Per UR Regulation:  Reviewed for med. necessity/level of care/duration of stay  If discussed at [place] of Stay Meetings, dates discussed:    Comments:

## 2014-12-11 NOTE — Progress Notes (Signed)
100 cc of fentaNYL (SUBLIMAZE) 2,500 mcg in sodium chloride 0.9 % 250 mL (10 mcg/mL) infusion wasted down sink.   40cc of midazolam (VERSED) 50 mg in sodium chloride 0.9 % 50 mL (1 mg/mL) infusion wasted down sink.  Witnessed by 2 RN's.  Loni Muse, RN

## 2014-12-11 NOTE — Progress Notes (Signed)
DAILY PROGRESS NOTE  Subjective:  Converted to sinus rhythm overnight. Echo yesterday shows EF of 50-55%, basal to mid inferior hypokinesis, moderately dilated aorta, mild AI, moderate MR. Rewarmed on arctic sun protocol. Creatinine rising today to 2.33. Minimal neurologic recovery at this point. EEG shows diffuse slowing.  Objective:  Temp:  [98.6 F (37 C)-99.5 F (37.5 C)] 99 F (37.2 C) (12/22 0800) Pulse Rate:  [72-98] 87 (12/22 0758) Resp:  [11-15] 12 (12/22 0900) BP: (108-136)/(46-60) 124/46 mmHg (12/21 2339) SpO2:  [100 %] 100 % (12/22 0900) Arterial Line BP: (114-153)/(46-68) 140/62 mmHg (12/22 0900) FiO2 (%):  [40 %] 40 % (12/22 0800) Weight:  [176 lb (79.833 kg)-199 lb 1.2 oz (90.3 kg)] 199 lb 1.2 oz (90.3 kg) (12/22 0457) Weight change:   Intake/Output from previous day: 12/21 0701 - 12/22 0700 In: 1039.6 [I.V.:499.3; NG/GT:240.3; IV Piggyback:300] Out: 7782 [Urine:1215]  Intake/Output from this shift: Total I/O In: 18 [I.V.:18] Out: -   Medications: Current Facility-Administered Medications  Medication Dose Route Frequency Provider Last Rate Last Dose  . 0.9 %  sodium chloride infusion  250 mL Intravenous PRN Peter M Martinique, MD 20 mL/hr at 12/09/14 1330 250 mL at 12/09/14 1330  . 0.9 %  sodium chloride infusion   Intravenous PRN Peter M Martinique, MD 10 mL/hr at 12/09/14 0833 500 mL at 12/09/14 0833  . ampicillin-sulbactam (UNASYN) 1.5 g in sodium chloride 0.9 % 50 mL IVPB  1.5 g Intravenous Q12H Benjamine Sprague [place], [facility]      . antiseptic oral rinse (CPC / CETYLPYRIDINIUM CHLORIDE 0.05%) solution 7 mL  7 mL Mouth Rinse QID Erick Colace, NP   7 mL at 12/11/14 0400  . chlorhexidine (PERIDEX) 0.12 % solution 15 mL  15 mL Mouth Rinse BID Erick Colace, NP   15 mL at 12/11/14 0930  . Chlorhexidine Gluconate Cloth 2 % PADS 6 each  6 each Topical Q0600 Peter M Martinique, MD   6 each at 12/11/14 0600  . cisatracurium (NIMBEX) bolus via infusion 8 mg  0.1 mg/kg  Intravenous Once Erick Colace, NP       And  . cisatracurium (NIMBEX) 200 mg in sodium chloride 0.9 % 200 mL (1 mg/mL) infusion  1-1.5 mcg/kg/min Intravenous Continuous Erick Colace, NP   Stopped at 12/10/14 4235   And  . cisatracurium (NIMBEX) bolus via infusion 4 mg  0.05 mg/kg Intravenous PRN Erick Colace, NP      . feeding supplement (VITAL AF 1.2 CAL) liquid 1,000 mL  1,000 mL Per Tube Continuous Heather Cornelison Pitts, RD   Stopped at 12/11/14 0500  . fentaNYL (SUBLIMAZE) 2,500 mcg in sodium chloride 0.9 % 250 mL (10 mcg/mL) infusion  25-400 mcg/hr Intravenous Continuous Erick Colace, NP   Stopped at 12/10/14 1000  . fentaNYL (SUBLIMAZE) bolus via infusion 25 mcg  25 mcg Intravenous Q30 min PRN Erick Colace, NP      . fentaNYL (SUBLIMAZE) injection 50 mcg  50 mcg Intravenous Once Erick Colace, NP      . heparin ADULT infusion 100 units/mL (25000 units/250 mL)  1,000 Units/hr Intravenous Continuous Deboraha Sprang, [facility]      . insulin aspart (novoLOG) injection 0-15 Units  0-15 Units Subcutaneous 6 times per day Erick Colace, NP   2 Units at 12/11/14 0428  . midazolam (VERSED) 50 mg in sodium chloride 0.9 % 50 mL (1 mg/mL) infusion  1-10 mg/hr Intravenous Continuous Collier Salina  Starleen Arms, NP   Stopped at 12/10/14 1100  . midazolam (VERSED) bolus via infusion 1 mg  1 mg Intravenous Q30 min PRN Erick Colace, NP      . mupirocin ointment (BACTROBAN) 2 % 1 application  1 application Nasal BID Peter M Martinique, MD   1 application at 76/73/41 [phone]  . norepinephrine (LEVOPHED) 16 mg in dextrose 5 % 250 mL (0.064 mg/mL) infusion  0-50 mcg/min Intravenous Titrated Peter M Martinique, MD   Stopped at 12/11/14 [phone]  . ondansetron (ZOFRAN) injection 4 mg  4 mg Intravenous Q8H PRN Colbert Coyer, MD      . pantoprazole sodium (PROTONIX) 40 mg/20 mL oral suspension 40 mg  40 mg Per Tube Daily Peter M Martinique, MD      . sodium chloride 0.9 % injection 10-40 mL  10-40 mL Intracatheter  Q12H Pixie Casino, MD   10 mL at 12/11/14 0930  . sodium chloride 0.9 % injection 10-40 mL  10-40 mL Intracatheter PRN Pixie Casino, MD      . sodium chloride 0.9 % injection 3 mL  3 mL Intravenous Q12H Peter M Martinique, MD   3 mL at 12/11/14 0930  . sodium chloride 0.9 % injection 3 mL  3 mL Intravenous PRN Peter M Martinique, MD        Physical Exam: General appearance: Ventilated, non-responsive Neck: no carotid bruit and no JVD Lungs: diminished breath sounds bilaterally Heart: regular rate and rhythm, S1, S2 normal and soft systolic murmur at apex 2/6 Abdomen: soft, non-distended, hypoactive bowel sounds Extremities: extremities normal, atraumatic, no cyanosis or edema Pulses: 1+ pulses Skin: pale, cool Neurologic: Mental status: non-responsive, off sedation, not following commands, Pupils 8m but reactive, gag with deep suction .  Lab Results: Results for orders placed or performed during the hospital encounter of 11/23/2014 (from the past 48 hour(s))  Glucose, capillary     Status: Abnormal   Collection Time: 12/09/14 10:19 AM  Result Value Ref Range   Glucose-Capillary 120 (H) 70 - 99 mg/dL   Comment 1 Arterial Sample   I-STAT, chem 8     Status: Abnormal   Collection Time: 12/09/14 10:24 AM  Result Value Ref Range   Sodium 142 137 - 147 mEq/L   Potassium 3.7 3.7 - 5.3 mEq/L   Chloride 110 96 - 112 mEq/L   BUN 37 (H) 6 - 23 mg/dL   Creatinine, Ser 1.60 (H) 0.50 - 1.35 mg/dL   Glucose, Bld 140 (H) 70 - 99 mg/dL   Calcium, Ion 1.18 1.13 - 1.30 mmol/L   TCO2 17 0 - 100 mmol/L   Hemoglobin 13.3 13.0 - 17.0 g/dL   HCT 39.0 39.0 - 52.0 %  Occult blood card to lab, stool RN will collect     Status: None   Collection Time: 12/09/14 11:00 AM  Result Value Ref Range   Fecal Occult Bld NEGATIVE NEGATIVE  Troponin I     Status: Abnormal   Collection Time: 12/09/14 11:45 AM  Result Value Ref Range   Troponin I >20.00 (HH) <0.30 ng/mL    Comment:        Due to the release  kinetics of cTnI, a negative result within the first hours of the onset of symptoms does not rule out myocardial infarction with certainty. If myocardial infarction is still suspected, repeat the test at appropriate intervals. CRITICAL VALUE NOTED.  VALUE IS CONSISTENT WITH PREVIOUSLY REPORTED AND CALLED VALUE.   Glucose,  capillary     Status: Abnormal   Collection Time: 12/09/14 12:18 PM  Result Value Ref Range   Glucose-Capillary 113 (H) 70 - 99 mg/dL   Comment 1 Arterial Sample   Glucose, capillary     Status: Abnormal   Collection Time: 12/09/14  2:03 PM  Result Value Ref Range   Glucose-Capillary 117 (H) 70 - 99 mg/dL   Comment 1 Arterial Sample   I-STAT, chem 8     Status: Abnormal   Collection Time: 12/09/14  2:08 PM  Result Value Ref Range   Sodium 139 137 - 147 mEq/L   Potassium 3.8 3.7 - 5.3 mEq/L   Chloride 116 (H) 96 - 112 mEq/L   BUN 34 (H) 6 - 23 mg/dL   Creatinine, Ser 1.60 (H) 0.50 - 1.35 mg/dL   Glucose, Bld 134 (H) 70 - 99 mg/dL   Calcium, Ion 1.10 (L) 1.13 - 1.30 mmol/L   TCO2 17 0 - 100 mmol/L   Hemoglobin 11.9 (L) 13.0 - 17.0 g/dL   HCT 35.0 (L) 39.0 - 52.0 %  Glucose, capillary     Status: Abnormal   Collection Time: 12/09/14  4:23 PM  Result Value Ref Range   Glucose-Capillary 120 (H) 70 - 99 mg/dL   Comment 1 Arterial Sample   Glucose, capillary     Status: Abnormal   Collection Time: 12/09/14  5:38 PM  Result Value Ref Range   Glucose-Capillary 127 (H) 70 - 99 mg/dL   Comment 1 Arterial Sample   I-STAT, chem 8     Status: Abnormal   Collection Time: 12/09/14  5:41 PM  Result Value Ref Range   Sodium 137 137 - 147 mEq/L   Potassium 3.8 3.7 - 5.3 mEq/L   Chloride 118 (H) 96 - 112 mEq/L   BUN 34 (H) 6 - 23 mg/dL   Creatinine, Ser 1.60 (H) 0.50 - 1.35 mg/dL   Glucose, Bld 142 (H) 70 - 99 mg/dL   Calcium, Ion 1.10 (L) 1.13 - 1.30 mmol/L   TCO2 18 0 - 100 mmol/L   Hemoglobin 11.9 (L) 13.0 - 17.0 g/dL   HCT 35.0 (L) 39.0 - 52.0 %  Heparin level  (unfractionated)     Status: Abnormal   Collection Time: 12/09/14  5:45 PM  Result Value Ref Range   Heparin Unfractionated 0.12 (L) 0.30 - 0.70 IU/mL    Comment:        IF HEPARIN RESULTS ARE BELOW EXPECTED VALUES, AND PATIENT DOSAGE HAS BEEN CONFIRMED, SUGGEST FOLLOW UP TESTING OF ANTITHROMBIN III LEVELS.   Glucose, capillary     Status: Abnormal   Collection Time: 12/09/14  8:13 PM  Result Value Ref Range   Glucose-Capillary 124 (H) 70 - 99 mg/dL   Comment 1 Venous Sample   Basic metabolic panel     Status: Abnormal   Collection Time: 12/09/14  9:45 PM  Result Value Ref Range   Sodium 138 137 - 147 mEq/L   Potassium 4.1 3.7 - 5.3 mEq/L   Chloride 105 96 - 112 mEq/L    Comment: DELTA CHECK NOTED   CO2 18 (L) 19 - 32 mEq/L   Glucose, Bld 138 (H) 70 - 99 mg/dL   BUN 37 (H) 6 - 23 mg/dL   Creatinine, Ser 1.51 (H) 0.50 - 1.35 mg/dL   Calcium 8.3 (L) 8.4 - 10.5 mg/dL   GFR calc non Af Amer 41 (L) >90 mL/min   GFR calc Af Amer 48 (L) >  90 mL/min    Comment: (NOTE) The eGFR has been calculated using the CKD EPI equation. This calculation has not been validated in all clinical situations. eGFR's persistently <90 mL/min signify possible Chronic Kidney Disease.    Anion gap 15 5 - 15  Glucose, capillary     Status: Abnormal   Collection Time: 12/10/14 12:13 AM  Result Value Ref Range   Glucose-Capillary 133 (H) 70 - 99 mg/dL   Comment 1 Venous Sample   Basic metabolic panel     Status: Abnormal   Collection Time: 12/10/14  2:19 AM  Result Value Ref Range   Sodium 140 137 - 147 mEq/L   Potassium 3.9 3.7 - 5.3 mEq/L   Chloride 105 96 - 112 mEq/L   CO2 19 19 - 32 mEq/L   Glucose, Bld 131 (H) 70 - 99 mg/dL   BUN 37 (H) 6 - 23 mg/dL   Creatinine, Ser 1.51 (H) 0.50 - 1.35 mg/dL   Calcium 8.3 (L) 8.4 - 10.5 mg/dL   GFR calc non Af Amer 41 (L) >90 mL/min   GFR calc Af Amer 48 (L) >90 mL/min    Comment: (NOTE) The eGFR has been calculated using the CKD EPI equation. This  calculation has not been validated in all clinical situations. eGFR's persistently <90 mL/min signify possible Chronic Kidney Disease.    Anion gap 16 (H) 5 - 15  Magnesium     Status: None   Collection Time: 12/10/14  2:19 AM  Result Value Ref Range   Magnesium 2.2 1.5 - 2.5 mg/dL  Basic metabolic panel     Status: Abnormal   Collection Time: 12/10/14  4:00 AM  Result Value Ref Range   Sodium 141 137 - 147 mEq/L   Potassium 4.0 3.7 - 5.3 mEq/L   Chloride 107 96 - 112 mEq/L   CO2 19 19 - 32 mEq/L   Glucose, Bld 137 (H) 70 - 99 mg/dL   BUN 36 (H) 6 - 23 mg/dL   Creatinine, Ser 1.56 (H) 0.50 - 1.35 mg/dL   Calcium 8.6 8.4 - 10.5 mg/dL   GFR calc non Af Amer 40 (L) >90 mL/min   GFR calc Af Amer 46 (L) >90 mL/min    Comment: (NOTE) The eGFR has been calculated using the CKD EPI equation. This calculation has not been validated in all clinical situations. eGFR's persistently <90 mL/min signify possible Chronic Kidney Disease.    Anion gap 15 5 - 15  CBC     Status: Abnormal   Collection Time: 12/10/14  4:00 AM  Result Value Ref Range   WBC 27.3 (H) 4.0 - 10.5 K/uL   RBC 3.96 (L) 4.22 - 5.81 MIL/uL   Hemoglobin 12.2 (L) 13.0 - 17.0 g/dL   HCT 35.7 (L) 39.0 - 52.0 %   MCV 90.2 78.0 - 100.0 fL   MCH 30.8 26.0 - 34.0 pg   MCHC 34.2 30.0 - 36.0 g/dL   RDW 16.5 (H) 11.5 - 15.5 %   Platelets 384 150 - 400 K/uL  Heparin level (unfractionated)     Status: None   Collection Time: 12/10/14  4:00 AM  Result Value Ref Range   Heparin Unfractionated 0.59 0.30 - 0.70 IU/mL    Comment:        IF HEPARIN RESULTS ARE BELOW EXPECTED VALUES, AND PATIENT DOSAGE HAS BEEN CONFIRMED, SUGGEST FOLLOW UP TESTING OF ANTITHROMBIN III LEVELS.   Glucose, capillary     Status: Abnormal  Collection Time: 12/10/14  4:09 AM  Result Value Ref Range   Glucose-Capillary 135 (H) 70 - 99 mg/dL   Comment 1 Venous Sample   Glucose, capillary     Status: Abnormal   Collection Time: 12/10/14  7:18 AM    Result Value Ref Range   Glucose-Capillary 112 (H) 70 - 99 mg/dL  Basic metabolic panel     Status: Abnormal   Collection Time: 12/10/14  7:20 AM  Result Value Ref Range   Sodium 142 137 - 147 mEq/L   Potassium 4.2 3.7 - 5.3 mEq/L   Chloride 108 96 - 112 mEq/L   CO2 19 19 - 32 mEq/L   Glucose, Bld 123 (H) 70 - 99 mg/dL   BUN 37 (H) 6 - 23 mg/dL   Creatinine, Ser 1.59 (H) 0.50 - 1.35 mg/dL   Calcium 8.3 (L) 8.4 - 10.5 mg/dL   GFR calc non Af Amer 39 (L) >90 mL/min   GFR calc Af Amer 45 (L) >90 mL/min    Comment: (NOTE) The eGFR has been calculated using the CKD EPI equation. This calculation has not been validated in all clinical situations. eGFR's persistently <90 mL/min signify possible Chronic Kidney Disease.    Anion gap 15 5 - 15  Heparin level (unfractionated)     Status: None   Collection Time: 12/10/14 10:30 AM  Result Value Ref Range   Heparin Unfractionated 0.47 0.30 - 0.70 IU/mL    Comment:        IF HEPARIN RESULTS ARE BELOW EXPECTED VALUES, AND PATIENT DOSAGE HAS BEEN CONFIRMED, SUGGEST FOLLOW UP TESTING OF ANTITHROMBIN III LEVELS.   Glucose, capillary     Status: Abnormal   Collection Time: 12/10/14 12:18 PM  Result Value Ref Range   Glucose-Capillary 46 (L) 70 - 99 mg/dL   Comment 1 Arterial Sample   Glucose, capillary     Status: Abnormal   Collection Time: 12/10/14 12:21 PM  Result Value Ref Range   Glucose-Capillary 107 (H) 70 - 99 mg/dL   Comment 1 Arterial Sample   Glucose, capillary     Status: Abnormal   Collection Time: 12/10/14  4:06 PM  Result Value Ref Range   Glucose-Capillary 107 (H) 70 - 99 mg/dL   Comment 1 Arterial Sample   Glucose, capillary     Status: None   Collection Time: 12/10/14  7:33 PM  Result Value Ref Range   Glucose-Capillary 83 70 - 99 mg/dL   Comment 1 Arterial Sample   Glucose, capillary     Status: Abnormal   Collection Time: 12/10/14 11:32 PM  Result Value Ref Range   Glucose-Capillary 112 (H) 70 - 99 mg/dL    Comment 1 Venous Sample    Comment 2 Arterial Sample   Glucose, capillary     Status: Abnormal   Collection Time: 12/11/14  4:03 AM  Result Value Ref Range   Glucose-Capillary 126 (H) 70 - 99 mg/dL  I-STAT 3, arterial blood gas (G3+)     Status: Abnormal   Collection Time: 12/11/14  4:10 AM  Result Value Ref Range   pH, Arterial 7.331 (L) 7.350 - 7.450   pCO2 arterial 37.0 35.0 - 45.0 mmHg   pO2, Arterial 136.0 (H) 80.0 - 100.0 mmHg   Bicarbonate 19.5 (L) 20.0 - 24.0 mEq/L   TCO2 21 0 - 100 mmol/L   O2 Saturation 99.0 %   Acid-base deficit 6.0 (H) 0.0 - 2.0 mmol/L   Patient temperature 37.1 C  Collection site RADIAL, ALLEN'S TEST ACCEPTABLE    Drawn by Nurse    Sample type ARTERIAL   CBC     Status: Abnormal   Collection Time: 12/11/14  4:20 AM  Result Value Ref Range   WBC 15.6 (H) 4.0 - 10.5 K/uL   RBC 3.54 (L) 4.22 - 5.81 MIL/uL   Hemoglobin 10.4 (L) 13.0 - 17.0 g/dL   HCT 31.8 (L) 39.0 - 52.0 %   MCV 89.8 78.0 - 100.0 fL   MCH 29.4 26.0 - 34.0 pg   MCHC 32.7 30.0 - 36.0 g/dL   RDW 17.1 (H) 11.5 - 15.5 %   Platelets 277 150 - 400 K/uL    Comment: DELTA CHECK NOTED REPEATED TO VERIFY   Basic metabolic panel     Status: Abnormal   Collection Time: 12/11/14  4:20 AM  Result Value Ref Range   Sodium 139 135 - 145 mmol/L    Comment: Please note change in reference range.   Potassium 4.2 3.5 - 5.1 mmol/L    Comment: Please note change in reference range.   Chloride 112 96 - 112 mEq/L   CO2 21 19 - 32 mmol/L   Glucose, Bld 129 (H) 70 - 99 mg/dL   BUN 37 (H) 6 - 23 mg/dL   Creatinine, Ser 2.33 (H) 0.50 - 1.35 mg/dL   Calcium 8.2 (L) 8.4 - 10.5 mg/dL   GFR calc non Af Amer 24 (L) >90 mL/min   GFR calc Af Amer 28 (L) >90 mL/min    Comment: (NOTE) The eGFR has been calculated using the CKD EPI equation. This calculation has not been validated in all clinical situations. eGFR's persistently <90 mL/min signify possible Chronic Kidney Disease.    Anion gap 6 5 - 15    Magnesium     Status: None   Collection Time: 12/11/14  4:20 AM  Result Value Ref Range   Magnesium 2.2 1.5 - 2.5 mg/dL  Phosphorus     Status: Abnormal   Collection Time: 12/11/14  4:20 AM  Result Value Ref Range   Phosphorus 4.9 (H) 2.3 - 4.6 mg/dL  Heparin level (unfractionated)     Status: None   Collection Time: 12/11/14  4:20 AM  Result Value Ref Range   Heparin Unfractionated 0.31 0.30 - 0.70 IU/mL    Comment:        IF HEPARIN RESULTS ARE BELOW EXPECTED VALUES, AND PATIENT DOSAGE HAS BEEN CONFIRMED, SUGGEST FOLLOW UP TESTING OF ANTITHROMBIN III LEVELS.   Glucose, capillary     Status: Abnormal   Collection Time: 12/11/14  9:34 AM  Result Value Ref Range   Glucose-Capillary 107 (H) 70 - 99 mg/dL   Comment 1 Capillary Sample     Imaging: Dg Chest Port 1 View  12/11/2014   CLINICAL DATA:  Shortness of breath.  EXAM: PORTABLE CHEST - 1 VIEW  COMPARISON:  12/10/2014.  FINDINGS: Endotracheal tube and NG tube in stable position. Right IJ line stable position. Stable cardiomegaly. Stable mild basilar atelectasis. No pleural effusion or pneumothorax. Prior cervical spine fusion.  IMPRESSION: 1. Lines and tubes in stable position. 2. Stable bibasilar atelectasis. 3. Stable cardiomegaly.  No pulmonary venous congestion.   Electronically Signed   By: Marcello Moores  Register   On: 12/11/2014 07:25   Dg Chest Port 1 View  12/10/2014   CLINICAL DATA:  Acute respiratory failure intubated  EXAM: PORTABLE CHEST - 1 VIEW  COMPARISON:  12/09/2014  FINDINGS: Endotracheal tube and NG  tube are unchanged. Central venous line is unchanged. Stable cardiac silhouette. There is mild basilar atelectasis. No pulmonary edema or infiltrate. No pneumothorax.  IMPRESSION: 1. Stable support apparatus. 2. Mild basilar atelectasis. 3. No change from prior.   Electronically Signed   By: Suzy Bouchard M.D.   On: 12/10/2014 07:13    Assessment:  Principal Problem:   Cardiac arrest Active Problems:   Acute  respiratory failure   Atrial fibrillation with rapid ventricular response   Pneumonia   Respiratory failure requiring intubation   Acute kidney injury   Ascending aortic aneurysm   Comatose   Plan:  1.  Cardiac arrest - unknown etiology - LV function is not bad on echo, however, this is inferior wall motion abnormality. Cath showed non-obstructive CAD. 2.  Ascending aortic aneurysm - the ascending aorta is moderately dilated, however, cannot assess further now due to worsening renal function 3.  Comatose - little recovery of mental status, now 24 hours after rewarming. Off sedation, per neurology there is diffuse slowing on EEG but no clear seizure activity. Prognosis is guarded at this time. 4.  Afib with RVR-  Converted to sinus. Remains on heparin. 5. Acute kidney injury - Worsening renal function, likely secondary to ATN. Avoid nephrotoxins and maintain adequate blood pressure. 6. Pneumonia - on antibiotic coverage with Unasyn per PCCM.  Prognosis is guarded at this point, with little neurologic recovery.  May need to consider family meeting in the next day or so to discuss goals of care and whether to continue with aggressive care or not.  Time Spent Directly with Patient:  15 minutes  Length of Stay:  LOS: 3 days   Pixie Casino, MD, [facility] Attending Cardiologist [facility]  Arlynn Stare C 12/11/2014, 10:10 AM

## 2014-12-11 NOTE — Progress Notes (Signed)
PULMONARY / CRITICAL CARE MEDICINE   Name: [name] MRN: [id number] DOB: 10-09-1930    ADMISSION DATE:  12/07/2014 CONSULTATION DATE:  12/19  REFERRING MD :  Leanne Lovely   CHIEF COMPLAINT:  Cardiopulmonary arrest   INITIAL PRESENTATION:  This is a 80 year old male. Only known hx on admit was prior cardiac hx w/ EF in 36s by [facility]. Was visiting his [facility] who is a patient at [facility] on 12/19. Was riding in car on way home  when slumped over and became unresponsive. EMS response time unclear. On arrival was in VF. Underwent ACLS estimated at ~20 minutes to ROSC. PCCM asked to admit s/p arrest   STUDIES:  Cardiac cath 12/19>>>  SIGNIFICANT EVENTS: 12/19 cardiac arrest. Estimated down time ~ 20 mintues 12/19: cath lab>>> 12/19 hypothermia started.   SUBJECTIVE:  Sedated, paralyzed, at goal temp Norepi up to 20, increasing with warming.  VITAL SIGNS: Temp:  [98.6 F (37 C)-99.5 F (37.5 C)] 99 F (37.2 C) (12/22 0800) Pulse Rate:  [72-98] 87 (12/22 0758) Resp:  [11-15] 12 (12/22 0900) BP: (124-136)/(46-60) 124/46 mmHg (12/21 2339) SpO2:  [100 %] 100 % (12/22 0900) Arterial Line BP: (114-153)/(46-68) 140/62 mmHg (12/22 0900) FiO2 (%):  [40 %] 40 % (12/22 0800) Weight:  [79.833 kg (176 lb)-90.3 kg (199 lb 1.2 oz)] 90.3 kg (199 lb 1.2 oz) (12/22 0457)  HEMODYNAMICS: CVP:  [2 mmHg-5 mmHg] 5 mmHg  VENTILATOR SETTINGS: Vent Mode:  [-] PRVC FiO2 (%):  [40 %] 40 % Set Rate:  [14 bmp] 14 bmp Vt Set:  [650 mL] 650 mL PEEP:  [5 cmH20] 5 cmH20 Plateau Pressure:  [10 cmH20-13 cmH20] 12 cmH20   INTAKE / OUTPUT:  Intake/Output Summary (Last 24 hours) at 12/11/14 1109 Last data filed at 12/11/14 0900  Gross per 24 hour  Intake 860.48 ml  Output    875 ml  Net -14.52 ml   PHYSICAL EXAMINATION: General:  Elderly 80 year old male, unresponsive on full vent support  Neuro: Sedated, paralyzed HEENT:  Edentulous, now orally intubated   Cardiovascular:  Reg, no MRG  Lungs:   Scattered rhonchi  Abdomen:  Soft, non-tender, + bowel sounds, old mid abd scar healed  Musculoskeletal:  Intact  Skin:  Cool and mottled   LABS:  CBC  Recent Labs Lab 12/14/2014 2145  12/09/14 1741 12/10/14 0400 12/11/14 0420  WBC 27.2*  --   --  27.3* 15.6*  HGB 11.4*  < > 11.9* 12.2* 10.4*  HCT 35.5*  < > 35.0* 35.7* 31.8*  PLT 428*  --   --  384 277  < > = values in this interval not displayed. Coag's  Recent Labs Lab 12/12/2014 1619 12/17/2014 1745 12/09/14 0145  APTT 40* 128* 35  INR 1.32 1.28 1.14   BMET  Recent Labs Lab 12/10/14 0400 12/10/14 0720 12/11/14 0420  NA 141 142 139  K 4.0 4.2 4.2  CL 107 108 112  CO2 19 19 21   BUN 36* 37* 37*  CREATININE 1.56* 1.59* 2.33*  GLUCOSE 137* 123* 129*   Electrolytes  Recent Labs Lab 12/10/14 0219 12/10/14 0400 12/10/14 0720 12/11/14 0420  CALCIUM 8.3* 8.6 8.3* 8.2*  MG 2.2  --   --  2.2  PHOS  --   --   --  4.9*   Sepsis Markers No results for input(s): LATICACIDVEN, PROCALCITON, O2SATVEN in the last 168 hours. ABG  Recent Labs Lab 12/09/14 0051 12/11/14 0410  PHART 7.320* 7.331*  PCO2ART 38.2 37.0  PO2ART 335.0* 136.0*   Liver Enzymes No results for input(s): AST, ALT, ALKPHOS, BILITOT, ALBUMIN in the last 168 hours. Cardiac Enzymes  Recent Labs Lab 12/17/2014 2118 12/09/14 0545 12/09/14 1145  TROPONINI >20.00* >20.00* >20.00*   Glucose  Recent Labs Lab 12/10/14 1221 12/10/14 1606 12/10/14 1933 12/10/14 2332 12/11/14 0403 12/11/14 0934  GLUCAP 107* 107* 83 112* 126* 107*   Imaging Dg Chest Port 1 View  12/10/2014   CLINICAL DATA:  Acute respiratory failure intubated  EXAM: PORTABLE CHEST - 1 VIEW  COMPARISON:  12/09/2014  FINDINGS: Endotracheal tube and NG tube are unchanged. Central venous line is unchanged. Stable cardiac silhouette. There is mild basilar atelectasis. No pulmonary edema or infiltrate. No pneumothorax.  IMPRESSION: 1. Stable support apparatus. 2. Mild basilar  atelectasis. 3. No change from prior.   Electronically Signed   By: Suzy Bouchard M.D.   On: 12/10/2014 07:13   ASSESSMENT / PLAN:  PULMONARY OETT 12/19>>> A: acute respiratory failure s/p cardiopulmonary arrest.  Pulmonary infiltrates-->improved w/ positive pressure  P:   Terminal extubation today.  CARDIOVASCULAR CVL right IJ CVL 12/19>>> A:  VT arrest Cardiogenic shock P:  No further interventions.  RENAL A:   AKI w/ + anion gap metabolic acidosis s/p cardiac arrest, S Cr stable 12/20 P:   Full comfort care. No further blood draws.  GASTROINTESTINAL A:  Prior surgical hx/ unknown Diarrhea P:   NPO  HEMATOLOGIC A:  Anemia of unclear chronicity   P:  D/C further blood draws.  INFECTIOUS A:  R/o aspiration  P:   Sputum 12/19>>NTD Abx: Unasyn 12/19>>> MRSA nasal positive  D/C abx.  ENDOCRINE A:  Hyperglycemia  P:   D/C CBGs. D/C insulin.  NEUROLOGIC A:   Acute encephalopathy  Worried about HIE. Down-time ~20 minutes  P:   Morphine for comfort.  FAMILY  - Updates: See below.  TODAY'S SUMMARY: Went to the patient's wife's room (a patient herself in 5N11), patient's children were there.  Had an extensive discussion with the entire family.  Informed in no uncertain terms that the neurologic work up is not complete.  EEG shows diffuse slowing.  Kidneys are now failing.  Will need additional work up and neuro consultation to opine on neuro prognosis.  The only thing certain is that patient will likely have brain damage.  They then informed me in no uncertain terms that patient had stated multiple times that he would have never wanted to be intubated in the first place and would have never wanted machines to prolong his life.  After discussion, I again reiterated that I can not give them any predictable prognosis other than the patient will likely have brain damage.  They again restated their wishes to terminate life support.  They in a single voice stated  that all they care about is comfort.  I informed them that patient will be made DNR, morphine started and will terminally extubate when they are ready.  They informed me they are ready now.  Given physical condition, age and lab findings I do not disagree with their decision and will proceed with withdrawal of life supporting measures.  The patient is critically ill with multiple organ systems failure and requires high complexity decision making for assessment and support, frequent evaluation and titration of therapies, application of advanced monitoring technologies and extensive interpretation of multiple databases.   Critical Care Time devoted to patient care services described in this note is  23  Minutes. This time reflects time of care of this signee Dr Jennet Maduro. This critical care time does not reflect procedure time, or teaching time or supervisory time of PA/NP/Med student/Med Resident etc but could involve care discussion time.  Rush Farmer, M.D. [facility] Pulmonary/Critical Care Medicine. Pager: [phone]. After hours pager: [phone].

## 2014-12-12 MED ORDER — SCOPOLAMINE 1 MG/3DAYS TD PT72
1.0000 | MEDICATED_PATCH | TRANSDERMAL | Status: DC
  Administered 2014-12-12 – 2014-12-18 (×3): 1.5 mg via TRANSDERMAL
  Filled 2014-12-12 (×5): qty 1

## 2014-12-12 MED ORDER — ATROPINE SULFATE 1 % OP SOLN
2.0000 [drp] | OPHTHALMIC | Status: DC | PRN
  Administered 2014-12-12: 2 [drp] via SUBLINGUAL
  Filled 2014-12-12: qty 2

## 2014-12-12 NOTE — Progress Notes (Signed)
Patient is now full comfort care. DNR. On morphine for pain and dyspnea. Will transfer to 6N palliative care. Gurgling upper airway secretions -on scopolamine patch. Anticipate death during this hospitalization. Family updated at the bedside.  Pixie Casino, MD, [facility] Attending Cardiologist [place]

## 2014-12-12 NOTE — Progress Notes (Signed)
Pt comfort care. Pt on morphine gtt appears to be comfortable. Family at bedside. Asked if they needed anything and explained to inform me if they felt that the pt became uncomfortable at any time. Will continue to assess and monitor the pt closely.

## 2014-12-12 NOTE — Progress Notes (Signed)
Nutrition Brief Note  Chart reviewed. Pt now transitioning to comfort care.  No further nutrition interventions warranted at this time.  Please re-consult as needed.   Angelik Walls RD, LDN, CNSC [phone] Pager [phone] After Hours Pager    

## 2014-12-13 DIAGNOSIS — R0689 Other abnormalities of breathing: Secondary | ICD-10-CM

## 2014-12-13 DIAGNOSIS — R4020 Unspecified coma: Secondary | ICD-10-CM

## 2014-12-13 DIAGNOSIS — R0600 Dyspnea, unspecified: Secondary | ICD-10-CM | POA: Diagnosis present

## 2014-12-13 DIAGNOSIS — Z515 Encounter for palliative care: Secondary | ICD-10-CM

## 2014-12-13 MED ORDER — LORAZEPAM 2 MG/ML IJ SOLN
1.0000 mg | INTRAMUSCULAR | Status: DC | PRN
  Administered 2014-12-17: 1 mg via INTRAVENOUS
  Filled 2014-12-13: qty 1

## 2014-12-13 MED ORDER — MORPHINE BOLUS VIA INFUSION
2.0000 mg | INTRAVENOUS | Status: DC | PRN
  Administered 2014-12-15 – 2014-12-17 (×35): 2 mg via INTRAVENOUS
  Filled 2014-12-13 (×36): qty 2

## 2014-12-13 MED ORDER — ATROPINE SULFATE 1 % OP SOLN
4.0000 [drp] | OPHTHALMIC | Status: DC | PRN
  Administered 2014-12-13 – 2014-12-17 (×8): 4 [drp] via SUBLINGUAL

## 2014-12-13 NOTE — Consult Note (Signed)
Patient [name]      DOB: [birth year]      OIZ:[id number]     Consult Note from the Palliative Medicine Team at [place] Requested by: Angelena Form, PA    PCP: Candee Furbish, MD Reason for Consultation: Hospice and symptom management  Phone Number:[phone]  Assessment of patients Current state: [name] appears comfortable. [name] are at bedside. They tells me that Mr. Sans is prepared to die and is ready to join his wife of 60 years that was the love of his life (he talked about her all the time). They tells me about what an outgoing and fun-loving person he was. They say he was always like a child at Christmas and this was a favorite time of year and his wife used to cook for days to prepare. They also say that he insisted on riding the newest roller-coaster at [employer] at age 34 yo and they could not talk him out of it. Family is at peace and know prognosis is close and likely hours to possibly days - he is already having apneic episodes with slow/shallow breathes.    Goals of Care: 1.  Code Status:DNR   2. Scope of Treatment: Comfort care.    4. Disposition: Anticipate hospital death.    3. Symptom Management:   1. Anxiety/Agitation: Lorazepam 1 mg every 4 hours prn.  2. Pain/dyspnea: Morphine gtt is at 10 mg/hr. Morphine bolus for 2 mg every 15 min prn.  3. Terminal Secretions: Scopolamine patch. Atropine SL prn.   4. Psychosocial: Emotional support provided to family at bedside. Patient is unresponsive.   5. Spiritual: Family tells me how they are happy that he will be joining their mother and the love of his life in heaven soon.    Brief HPI: 80 yo male admitted with cardiopulmonary arrest after visiting his wife (a patient here at [facility]) requiring ~20 minutes of ACLS to ROSC. PMH of HTN, hyperlipidemia, CAD, CKD stage 3. Now unresponsive and actively dying.    ROS: Unable to assess - unresponsive.     PMH:   Past Medical History  Diagnosis Date  . HTN (hypertension)   . Hyperlipidemia   . Carotid arterial disease   . CKD (chronic kidney disease) stage 3, GFR 30-59 ml/min      PSH: Past Surgical History  Procedure Laterality Date  . Prostate surgery    . Knee surgery    . Left heart catheterization with coronary angiogram N/A 11/22/2014    Procedure: LEFT HEART CATHETERIZATION WITH CORONARY ANGIOGRAM;  Surgeon: Peter M Martinique, MD;  Location: [facility] CATH LAB;  Service: Cardiovascular;  Laterality: N/A;   I have reviewed the FH and SH and  If appropriate update it with new information. No Known Allergies Scheduled Meds: . antiseptic oral rinse  7 mL Mouth Rinse QID  . chlorhexidine  15 mL Mouth Rinse BID  . scopolamine  1 patch Transdermal Q72H   Continuous Infusions: . morphine 10 mg/hr (12/12/14 1230)   PRN Meds:.sodium chloride, atropine, morphine, ondansetron    BP 103/48 mmHg  Pulse 68  Temp(Src) 101 F (38.3 C) (Axillary)  Resp 8  Ht 6\' 2"  (1.88 m)  Wt 90.3 kg (199 lb 1.2 oz)  BMI 25.55 kg/m2  SpO2 87%   PPS: 10%   Intake/Output Summary (Last 24 hours) at 12/13/14 1050 Last data filed at 12/13/14 0558  Gross per 24 hour  Intake    670  ml  Output    575 ml  Net     95 ml    Physical Exam:  General: NAD, unresponsive HEENT: Temporal muscle wasting, no JVD, moist mucous membranes Chest: Episodes of apnea CVS: RRR Abdomen: Soft, NT, ND, +BS Ext: Bilat feet cool to touch and mottling Neuro: Unresponsive  Labs: CBC    Component Value Date/Time   WBC 15.6* 12/11/2014 0420   RBC 3.54* 12/11/2014 0420   HGB 10.4* 12/11/2014 0420   HCT 31.8* 12/11/2014 0420   PLT 277 12/11/2014 0420   MCV 89.8 12/11/2014 0420   MCH 29.4 12/11/2014 0420   MCHC 32.7 12/11/2014 0420   RDW 17.1* 12/11/2014 0420   LYMPHSABS 3.7 11/28/2014 1619   MONOABS 0.7 12/20/2014 1619   EOSABS 0.3 12/07/2014 1619   BASOSABS 0.1 12/14/2014 1619    BMET    Component Value Date/Time    NA 139 12/11/2014 0420   K 4.2 12/11/2014 0420   CL 112 12/11/2014 0420   CO2 21 12/11/2014 0420   GLUCOSE 129* 12/11/2014 0420   BUN 37* 12/11/2014 0420   CREATININE 2.33* 12/11/2014 0420   CALCIUM 8.2* 12/11/2014 0420   GFRNONAA 24* 12/11/2014 0420   GFRAA 28* 12/11/2014 0420    CMP     Component Value Date/Time   NA 139 12/11/2014 0420   K 4.2 12/11/2014 0420   CL 112 12/11/2014 0420   CO2 21 12/11/2014 0420   GLUCOSE 129* 12/11/2014 0420   BUN 37* 12/11/2014 0420   CREATININE 2.33* 12/11/2014 0420   CALCIUM 8.2* 12/11/2014 0420   GFRNONAA 24* 12/11/2014 0420   GFRAA 28* 12/11/2014 0420    Time In Time Out Total Time Spent with Patient Total Overall Time  1010 1050 30min 63min    Greater than 50%  of this time was spent counseling and coordinating care related to the above assessment and plan.  Vinie Sill, NP Palliative Medicine Team Pager # [phone] (M-F 8a-5p) Team Phone # [phone] (Nights/Weekends)

## 2014-12-13 NOTE — Progress Notes (Signed)
Respiratory therapy notified about order to discontinue A line

## 2014-12-13 NOTE — Progress Notes (Signed)
12/13/2014- Respiratory care note- RT notified of pt on 6N with aline in place. Aline discontinued as per MD order.  Site clean and dry with good skin color.  No complications noted.  Pressure held at site for 10 minutes. No bleeding at site once line removed.  Sterile gauze pressure dressing applied post removal.  Rn aware and in room during removal process.  Family also at bedside.  s Kailo Kosik rrt, rcp

## 2014-12-13 NOTE — Progress Notes (Addendum)
    DAILY PROGRESS NOTE  Subjective:  The patient was turned to palliative care yesterday, comfort care only, on morphine drip, appears comfortable, family (son and daughter in law) are at the bedside. He is DNR.  Objective:  Temp:  [99.7 F (37.6 C)-101 F (38.3 C)] 101 F (38.3 C) (12/24 0558) Pulse Rate:  [68-98] 68 (12/24 0558) Resp:  [6-8] 8 (12/24 0558) BP: (103-121)/(48-51) 103/48 mmHg (12/24 0558) SpO2:  [87 %-97 %] 87 % (12/24 0558) Weight change:   Intake/Output from previous day: 12/23 0701 - 12/24 0700 In: 680 [I.V.:680] Out: 575 [Urine:575]  Intake/Output from this shift:    Medications: Current Facility-Administered Medications  Medication Dose Route Frequency Provider Last Rate Last Dose  . 0.9 %  sodium chloride infusion   Intravenous PRN Peter M Martinique, MD 10 mL/hr at 12/12/14 1100    . antiseptic oral rinse (CPC / CETYLPYRIDINIUM CHLORIDE 0.05%) solution 7 mL  7 mL Mouth Rinse QID Erick Colace, NP   7 mL at 12/13/14 0404  . atropine 1 % ophthalmic solution 4 drop  4 drop Sublingual P1S PRN Pershing Proud, NP      . chlorhexidine (PERIDEX) 0.12 % solution 15 mL  15 mL Mouth Rinse BID Erick Colace, NP   15 mL at 12/12/14 2200  . LORazepam (ATIVAN) injection 1 mg  1 mg Intravenous R1R PRN Pershing Proud, NP      . morphine 250 mg in dextrose 5 % 250 mL (1 mg/mL) infusion  10 mg/hr Intravenous Continuous Rush Farmer, MD 10 mL/hr at 12/12/14 1230 10 mg/hr at 12/12/14 1230  . morphine bolus via infusion 5-20 mg  5-20 mg Intravenous Q15 min PRN Rush Farmer, MD      . ondansetron [facility]) injection 4 mg  4 mg Intravenous Q8H PRN Colbert Coyer, MD      . scopolamine (TRANSDERM-SCOP) 1 MG/3DAYS 1.5 mg  1 patch Transdermal Q72H Kara Mead V, MD   1.5 mg at 12/12/14 9458    Physical Exam: General appearance: non-responsive Neck: no carotid bruit and no JVD Lungs: diminished breath sounds bilaterally Heart: regular rate and rhythm, S1, S2  normal and soft systolic murmur at apex 2/6 Abdomen: soft, non-distended, hypoactive bowel sounds Extremities: extremities normal, atraumatic, no cyanosis or edema Pulses: 1+ pulses Skin: pale, cool Neurologic: Mental status: non-responsive, off sedation, not following commands, Pupils 74mm but reactive, gag with deep suction .  Lab Results: No results found for this or any previous visit (from the past 48 hour(s)).  Imaging: No results found.  Assessment:  Principal Problem:  Cardiac arrest Active Problems:    Acute respiratory failure    Atrial fibrillation with rapid ventricular response    Pneumonia    Respiratory failure requiring intubation   Acute kidney injury    Comatose   Plan:   80 year old male post cardiac arrest on 12/19, comatose since then, significantly impaired LVEF, multiorgan failure. The patient was transferred to palliative care unit yesterday. He was started on morphine drip and scopolamine patch. He is DNR. He appears very comfortable, family is bedside.  Family understands the current condition and wishes comfort care only. Consult to palliative care if any further comfort measures needed. If the patient becomes tachypnic, we will increase morphine drip dose. We will remove a-line.   Ena Dawley H 12/13/2014, 11:09 AM

## 2014-12-14 ENCOUNTER — Encounter (HOSPITAL_COMMUNITY): Payer: Self-pay

## 2014-12-14 NOTE — Progress Notes (Signed)
SUBJECTIVE:  No acute events. Discussed with patient's family at the bedside. They do not feel he has been having any shortness of breath or any type of discomfort. They do notice his apneic spells. Also discussed the case with the nurse. She reported no significant events. The patient has not able to answer questions.  OBJECTIVE:   Vitals:   Filed Vitals:   12/12/14 1141 12/13/14 0558 12/13/14 1205 12/14/14 0649  BP: 121/51 103/48 86/36 109/51  Pulse: 98 68 126 84  Temp: 99.7 F (37.6 C) 101 F (38.3 C) 99.6 F (37.6 C) 100.2 F (37.9 C)  TempSrc: Oral Axillary Axillary Axillary  Resp: 6 8 8 14   Height:      Weight:      SpO2: 97% 87% 80% 83%   I&O's:   Intake/Output Summary (Last 24 hours) at 12/14/14 9357 Last data filed at 12/14/14 0500  Gross per 24 hour  Intake      0 ml  Output    200 ml  Net   -200 ml        PHYSICAL EXAM General: Frail, in no acute distress Head:   Normal cephalic and atramatic  Lungs:  No wheezing Heart:  Irregular, S1-S2   Abdomen: abdomen soft and non-tender  Extremities:   No edema.    Skin: No rash   LABS: Basic Metabolic Panel: No results for input(s): NA, K, CL, CO2, GLUCOSE, BUN, CREATININE, CALCIUM, MG, PHOS in the last 72 hours. Liver Function Tests: No results for input(s): AST, ALT, ALKPHOS, BILITOT, PROT, ALBUMIN in the last 72 hours. No results for input(s): LIPASE, AMYLASE in the last 72 hours. CBC: No results for input(s): WBC, NEUTROABS, HGB, HCT, MCV, PLT in the last 72 hours. Cardiac Enzymes: No results for input(s): CKTOTAL, CKMB, CKMBINDEX, TROPONINI in the last 72 hours. BNP: Invalid input(s): POCBNP D-Dimer: No results for input(s): DDIMER in the last 72 hours. Hemoglobin A1C: No results for input(s): HGBA1C in the last 72 hours. Fasting Lipid Panel: No results for input(s): CHOL, HDL, LDLCALC, TRIG, CHOLHDL, LDLDIRECT in the last 72 hours. Thyroid Function Tests: No results for input(s): TSH, T4TOTAL,  T3FREE, THYROIDAB in the last 72 hours.  Invalid input(s): FREET3 Anemia Panel: No results for input(s): VITAMINB12, FOLATE, FERRITIN, TIBC, IRON, RETICCTPCT in the last 72 hours. Coag Panel:   Lab Results  Component Value Date   INR 1.14 12/09/2014   INR 1.28 12/15/2014   INR 1.32 12/04/2014    RADIOLOGY: Dg Chest Port 1 View  12/11/2014   CLINICAL DATA:  Shortness of breath.  EXAM: PORTABLE CHEST - 1 VIEW  COMPARISON:  12/10/2014.  FINDINGS: Endotracheal tube and NG tube in stable position. Right IJ line stable position. Stable cardiomegaly. Stable mild basilar atelectasis. No pleural effusion or pneumothorax. Prior cervical spine fusion.  IMPRESSION: 1. Lines and tubes in stable position. 2. Stable bibasilar atelectasis. 3. Stable cardiomegaly.  No pulmonary venous congestion.   Electronically Signed   By: Marcello Moores  Register   On: 12/11/2014 07:25   Dg Chest Port 1 View  12/10/2014   CLINICAL DATA:  Acute respiratory failure intubated  EXAM: PORTABLE CHEST - 1 VIEW  COMPARISON:  12/09/2014  FINDINGS: Endotracheal tube and NG tube are unchanged. Central venous line is unchanged. Stable cardiac silhouette. There is mild basilar atelectasis. No pulmonary edema or infiltrate. No pneumothorax.  IMPRESSION: 1. Stable support apparatus. 2. Mild basilar atelectasis. 3. No change from prior.   Electronically Signed   By:  Suzy Bouchard M.D.   On: 12/10/2014 07:13   Dg Chest Port 1 View  12/09/2014   CLINICAL DATA:  Difficulty breathing/hypoxia  EXAM: PORTABLE CHEST - 1 VIEW  COMPARISON:  December 08, 2014  FINDINGS: Endotracheal tube tip is 5.7 cm above the carina. Central catheter tip is in the superior vena cava. Nasogastric tube tip and side port in stomach. No pneumothorax. There is a focal area of left base atelectasis. Elsewhere lungs are clear. Heart is upper normal in size with pulmonary vascularity within normal limits. No adenopathy. No bone lesions.  IMPRESSION: Tube and catheter  positions as described without pneumothorax. Atelectasis left base. This finding may represent early pneumonia. Elsewhere lungs clear. No change in cardiac silhouette.   Electronically Signed   By: Lowella Grip M.D.   On: 12/09/2014 07:01   Dg Chest Port 1 View  12/09/2014   CLINICAL DATA:  Respiratory failure.  Intubated.  EXAM: PORTABLE CHEST - 1 VIEW  COMPARISON:  Portable film earlier today.  FINDINGS: Endotracheal tube has been advanced and now lies 16 mm above the carina. The heart remains mildly enlarged. No focal lung opacities. RIGHT IJ catheter tip cavoatrial junction. No pneumothorax.  IMPRESSION: Satisfactory ETT placement.  No active cardiopulmonary disease.   Electronically Signed   By: Rolla Flatten M.D.   On: 11/29/2014 19:37   Dg Chest Portable 1 View  12/03/2014   CLINICAL DATA:  Difficult intubation  EXAM: PORTABLE CHEST - 1 VIEW  COMPARISON:  12/13/2014  FINDINGS: Endotracheal tube at the thoracic inlet.  Cardiomegaly with pulmonary vascular congestion. Increased interstitial markings, favored to be chronic. Mild interstitial edema is possible. No pleural effusion or pneumothorax.  Right IJ venous catheter terminates in the lower SVC.  IMPRESSION: Endotracheal tube at the thoracic inlet.  Right IJ venous catheter terminates in the lower SVC. No pneumothorax.   Electronically Signed   By: Julian Hy M.D.   On: 12/15/2014 17:41   Dg Chest Portable 1 View  12/01/2014   CLINICAL DATA:  Cardiac arrest.  EXAM: PORTABLE CHEST - 1 VIEW  COMPARISON:  None.  FINDINGS: Cardiomegaly. Mild vascular congestion. Telemetry leads overlie the chest. Prior cervical fusion. No effusion or pneumothorax.  IMPRESSION: Cardiomegaly with mild vascular congestion.   Electronically Signed   By: Rolla Flatten M.D.   On: 12/06/2014 17:17   Dg Abd Portable 1v  12/16/2014   CLINICAL DATA:  Nasogastric tube placement  EXAM: PORTABLE ABDOMEN - 1 VIEW  COMPARISON:  None.  FINDINGS: Nasogastric tube tip  lies in the stomach. Visualized bowel gas unremarkable.  IMPRESSION: NG tube tip in the stomach.   Electronically Signed   By: Rolla Flatten M.D.   On: 12/04/2014 19:38      ASSESSMENT: Status post cardiac arrest with multisystem organ failure  PLAN:  Palliative care is the current plan. No signs of any discomfort. Continue morphine drip. No further blood draws planned. The patient's family is aware and appreciative of the care he is receiving.  We'll consult palliative care if any further issues arise.  Jettie Booze, MD  12/14/2014  9:29 AM

## 2014-12-15 NOTE — Progress Notes (Signed)
    DAILY PROGRESS NOTE  Subjective:  Unresponsive; family states pt appears more comfortable after morphine this AM.  Objective:  Temp:  [99.7 F (37.6 C)] 99.7 F (37.6 C) (12/26 0536) Pulse Rate:  [77] 77 (12/26 0536) Resp:  [19] 19 (12/26 0536) BP: (112)/(57) 112/57 mmHg (12/26 0536) SpO2:  [91 %] 91 % (12/26 0536) Weight change:   Intake/Output from previous day: 12/25 0701 - 12/26 0700 In: 80 [I.V.:80] Out: 800 [Urine:800]  Intake/Output from this shift:    Medications: Current Facility-Administered Medications  Medication Dose Route Frequency Provider Last Rate Last Dose  . 0.9 %  sodium chloride infusion   Intravenous PRN Peter M Martinique, MD 10 mL/hr at 12/12/14 1100    . antiseptic oral rinse (CPC / CETYLPYRIDINIUM CHLORIDE 0.05%) solution 7 mL  7 mL Mouth Rinse QID Erick Colace, NP   7 mL at 12/15/14 0414  . atropine 1 % ophthalmic solution 4 drop  4 drop Sublingual R4E PRN Pershing Proud, NP   4 drop at 12/15/14 0432  . chlorhexidine (PERIDEX) 0.12 % solution 15 mL  15 mL Mouth Rinse BID Erick Colace, NP   15 mL at 12/14/14 0800  . LORazepam (ATIVAN) injection 1 mg  1 mg Intravenous R1V PRN Pershing Proud, NP      . morphine 250 mg in dextrose 5 % 250 mL (1 mg/mL) infusion  10 mg/hr Intravenous Continuous Rush Farmer, MD 10 mL/hr at 12/14/14 1603 10 mg/hr at 12/14/14 1603  . morphine bolus via infusion 2 mg  2 mg Intravenous Q00 min PRN Pershing Proud, NP   2 mg at 12/15/14 0604  . ondansetron (ZOFRAN) injection 4 mg  4 mg Intravenous Q8H PRN Colbert Coyer, MD      . scopolamine (TRANSDERM-SCOP) 1 MG/3DAYS 1.5 mg  1 patch Transdermal Q72H Kara Mead V, MD   1.5 mg at 12/15/14 0202    Physical Exam: General appearance: non-responsive Lungs: rhonchi Heart: irregular Abdomen: soft, non-distended Extremities: no edema Skin: pale, cool Neurologic: Mental status: non-responsive . Assessment/Plan: 80 year old male post cardiac arrest on 12/19,  comatose since then, multiorgan failure. The patient has been transitioned to palliative care. He was started on morphine drip and scopolamine patch. He is DNR. He appears comfortable, family is bedside.  Family understands the current condition and wishes comfort care only. Consult to palliative care if any further comfort measures needed. If the patient becomes tachypnic, we will increase morphine drip dose.    Kirk Ruths 12/15/2014, 8:58 AM

## 2014-12-15 NOTE — Plan of Care (Signed)
Problem: Phase I Progression Outcomes Goal: Voiding-avoid urinary catheter unless indicated Outcome: Not Progressing Foley in for duration- urine output good at present. Urine amber

## 2014-12-16 NOTE — Progress Notes (Signed)
    DAILY PROGRESS NOTE  Subjective:  Unresponsive; family states pt appeared more uncomfortable over past 24 hours  Objective:  Temp:  [98.7 F (37.1 C)] 98.7 F (37.1 C) (12/27 0543) Pulse Rate:  [74] 74 (12/27 0543) Resp:  [15] 15 (12/27 0543) BP: (114)/(57) 114/57 mmHg (12/27 0543) SpO2:  [86 %] 86 % (12/27 0543) Weight change:   Intake/Output from previous day: 12/26 0701 - 12/27 0700 In: 380 [I.V.:380] Out: 5750 [Urine:5750]  Intake/Output from this shift:    Medications: Current Facility-Administered Medications  Medication Dose Route Frequency Provider Last Rate Last Dose  . 0.9 %  sodium chloride infusion   Intravenous PRN Peter M Martinique, MD 10 mL/hr at 12/16/14 0500    . antiseptic oral rinse (CPC / CETYLPYRIDINIUM CHLORIDE 0.05%) solution 7 mL  7 mL Mouth Rinse QID Erick Colace, NP   7 mL at 12/16/14 0444  . atropine 1 % ophthalmic solution 4 drop  4 drop Sublingual D2K PRN Pershing Proud, NP   4 drop at 12/15/14 0432  . chlorhexidine (PERIDEX) 0.12 % solution 15 mL  15 mL Mouth Rinse BID Erick Colace, NP   15 mL at 12/16/14 0727  . LORazepam (ATIVAN) injection 1 mg  1 mg Intravenous G2R PRN Pershing Proud, NP      . morphine 250 mg in dextrose 5 % 250 mL (1 mg/mL) infusion  10 mg/hr Intravenous Continuous Rush Farmer, MD 10 mL/hr at 12/15/14 1532 10 mg/hr at 12/15/14 1532  . morphine bolus via infusion 2 mg  2 mg Intravenous K27 min PRN Pershing Proud, NP   2 mg at 12/16/14 0908  . ondansetron (ZOFRAN) injection 4 mg  4 mg Intravenous Q8H PRN Colbert Coyer, MD      . scopolamine (TRANSDERM-SCOP) 1 MG/3DAYS 1.5 mg  1 patch Transdermal Q72H Kara Mead V, MD   1.5 mg at 12/15/14 0202    Physical Exam: General appearance: non-responsive Lungs: rhonchi Heart: irregular Abdomen: soft, non-distended Extremities: no edema Skin: pale, cool Neurologic: Mental status: non-responsive . Assessment/Plan: 80 year old male post cardiac arrest on  12/19, comatose since then, multiorgan failure. The patient has been transitioned to palliative care. He was started on morphine drip and scopolamine patch. He is DNR. Family understands the current condition and wishes comfort care only. Patient more uncomfortable per family; will give additional morphine boluses as needed. Consult to palliative care if any further comfort measures needed.   [name] 12/16/2014, 9:12 AM

## 2014-12-17 DIAGNOSIS — R451 Restlessness and agitation: Secondary | ICD-10-CM | POA: Diagnosis not present

## 2014-12-17 MED ORDER — LORAZEPAM 2 MG/ML IJ SOLN
1.0000 mg | INTRAMUSCULAR | Status: DC
  Administered 2014-12-17 – 2014-12-18 (×9): 1 mg via INTRAVENOUS
  Filled 2014-12-17 (×7): qty 1

## 2014-12-17 MED ORDER — LORAZEPAM 2 MG/ML IJ SOLN
1.0000 mg | INTRAMUSCULAR | Status: DC | PRN
  Filled 2014-12-17 (×2): qty 1

## 2014-12-17 MED ORDER — ATROPINE SULFATE 1 % OP SOLN
4.0000 [drp] | OPHTHALMIC | Status: DC
  Administered 2014-12-17 – 2014-12-18 (×9): 4 [drp] via SUBLINGUAL
  Filled 2014-12-17: qty 2

## 2014-12-17 NOTE — Clinical Social Work Note (Signed)
Documented that hospital death is anticipated.  Please re-consult CSW if disposition changes.  Domenica Reamer, [place] Social Worker [phone]

## 2014-12-17 NOTE — Progress Notes (Signed)
Progress Note from the Palliative Medicine Team at [facility]: [name] is lying in bed, moaning from time to time, continuing to have worsening apneic episodes, and is having worse control over secretions. I am surprised as is family that he is still here with [place] and I would anticipate death soon given his episodes of apnea. Family is exhausted at bedside - offered emotional support.    Objective: No Known Allergies Scheduled Meds: . antiseptic oral rinse  7 mL Mouth Rinse QID  . atropine  4 drop Sublingual 6 times per day  . chlorhexidine  15 mL Mouth Rinse BID  . LORazepam  1 mg Intravenous 6 times per day  . scopolamine  1 patch Transdermal Q72H   Continuous Infusions: . morphine 12.5 mg/hr (12/17/14 0853)   PRN Meds:.sodium chloride, LORazepam, morphine, ondansetron  BP 100/47 mmHg  Pulse 77  Temp(Src) 101.6 F (38.7 C) (Axillary)  Resp 12  Ht 6\' 2"  (1.88 m)  Wt 90.3 kg (199 lb 1.2 oz)  BMI 25.55 kg/m2  SpO2 81%   PPS: 10%   Intake/Output Summary (Last 24 hours) at 12/17/14 0913 Last data filed at 12/17/14 0600  Gross per 24 hour  Intake      0 ml  Output    400 ml  Net   -400 ml        Physical Exam:  General: NAD, unresponsive, moaning HEENT: Temporal muscle wasting, moist mucous membranes, terminal secretions - gurgling Chest: Episodes of apnea worsening CVS: RRR Abdomen: Soft, NT, ND, +BS Ext: Bilat feet 1+ edema Neuro: Unresponsive   Labs: CBC    Component Value Date/Time   WBC 15.6* 12/11/2014 0420   RBC 3.54* 12/11/2014 0420   HGB 10.4* 12/11/2014 0420   HCT 31.8* 12/11/2014 0420   PLT 277 12/11/2014 0420   MCV 89.8 12/11/2014 0420   MCH 29.4 12/11/2014 0420   MCHC 32.7 12/11/2014 0420   RDW 17.1* 12/11/2014 0420   LYMPHSABS 3.7 12/09/2014 1619   MONOABS 0.7 12/17/2014 1619   EOSABS 0.3 12/13/2014 1619   BASOSABS 0.1 12/04/2014 1619    BMET    Component Value Date/Time   NA 139 12/11/2014 0420   K 4.2 12/11/2014  0420   CL 112 12/11/2014 0420   CO2 21 12/11/2014 0420   GLUCOSE 129* 12/11/2014 0420   BUN 37* 12/11/2014 0420   CREATININE 2.33* 12/11/2014 0420   CALCIUM 8.2* 12/11/2014 0420   GFRNONAA 24* 12/11/2014 0420   GFRAA 28* 12/11/2014 0420    CMP     Component Value Date/Time   NA 139 12/11/2014 0420   K 4.2 12/11/2014 0420   CL 112 12/11/2014 0420   CO2 21 12/11/2014 0420   GLUCOSE 129* 12/11/2014 0420   BUN 37* 12/11/2014 0420   CREATININE 2.33* 12/11/2014 0420   CALCIUM 8.2* 12/11/2014 0420   GFRNONAA 24* 12/11/2014 0420   GFRAA 28* 12/11/2014 0420    Assessment and Plan: 1. Code Status: DNR 2. Symptom Control: 1. Anxiety/Agitation: Lorazepam 1 mg every 4 hours - may increase to 2 mg if 1 mg is not effective. Lorazepam 1 mg every 2 hours prn.  2. Pain/dyspnea: Morphine gtt is at 10 mg/hr. Morphine bolus for 2 mg every 15 min prn.  3. Terminal Secretions: Scopolamine patch. Atropine SL every 4 hours scheduled now.  3. Psycho/Social: Emotional support provided to family at bedside.  4. Spiritual: Spiritual care consult placed for continued family emotional support and comfort.  5. Disposition: Anticipate hospital death.     Time In Time Out Total Time Spent with Patient Total Overall Time  0850 0910 35min 32min    Greater than 50%  of this time was spent counseling and coordinating care related to the above assessment and plan.  Vinie Sill, NP Palliative Medicine Team Pager # [phone] (M-F 8a-5p) Team Phone # [phone] (Nights/Weekends)

## 2014-12-17 NOTE — Progress Notes (Signed)
Responded to spiritual care consult to continue support to family at bedside. Patient's son, daughter-in-law  were present. Patient son expressed his appreciation for the care and support the hospital and staff have provided to he and his family. I prayed with family per their request and offered blessings of peace and comfort.  Family's pastor and church are involved and actively supporting family.   12/17/14 1000  Clinical Encounter Type  Visited With Patient and family together;Health care provider  Visit Type Initial;Follow-up;Spiritual support;Patient actively dying  Referral From Physician  Spiritual Encounters  Spiritual Needs Prayer;Emotional;Grief support  Stress Factors  Family Stress Factors Loss;Major life changes  Jaclynn Major, [place]

## 2014-12-18 DIAGNOSIS — R451 Restlessness and agitation: Secondary | ICD-10-CM

## 2014-12-19 ENCOUNTER — Encounter: Payer: Self-pay | Admitting: Interventional Cardiology

## 2014-12-19 NOTE — Progress Notes (Signed)
Called by pt's son that pt is not breathing anymore. Upon assessment pt has no respiration, no Hr. Verified by 2nd Rn.

## 2014-12-19 NOTE — Progress Notes (Signed)
Wasted 145 ml of Morphine 1mg /ml with Hosie Spangle, Rn.

## 2014-12-20 ENCOUNTER — Telehealth: Payer: Self-pay | Admitting: Interventional Cardiology

## 2014-12-20 NOTE — Telephone Encounter (Signed)
Call rec from [place] with [facility] in [place] she is mailing a D/C here for Dr. Irish Lack to sign she is also aware that Dr.Varanasi is not back in the office until  12/25/2014. I will speak with who is covering Dr.Varanasi and see if he will sign.  Call back for [place] home # [phone]

## 2014-12-21 NOTE — Progress Notes (Signed)
    Appreciate palliative care team. Note reviewed. Comfort care/symptom control.  Anticipating hospital death.   Candee Furbish, MD

## 2014-12-21 NOTE — Progress Notes (Signed)
Progress Note from the Palliative Medicine Team at [facility]  Subjective: There is not much change today. Family continues to keep vigil at bedside. Mr. Neuser continues to have apneic episodes and his febrile, hypotensive, and more hypoxic now. Do not see any mottling to extremities. I cannot imagine he could continue much longer in this state - but I am honestly surprised that he is still living today. Provided support to family at bedside and facilitated story telling and sharing of memories. Son reports that his moaning and agitation has been relieved with the ativan and that his night last night was much more relaxed and comfortable.     Objective: No Known Allergies Scheduled Meds: . antiseptic oral rinse  7 mL Mouth Rinse QID  . atropine  4 drop Sublingual 6 times per day  . chlorhexidine  15 mL Mouth Rinse BID  . LORazepam  1 mg Intravenous 6 times per day  . scopolamine  1 patch Transdermal Q72H   Continuous Infusions: . morphine 12.5 mg/hr (12/17/14 1310)   PRN Meds:.sodium chloride, LORazepam, morphine, ondansetron  BP 83/32 mmHg  Pulse 77  Temp(Src) 102 F (38.9 C) (Axillary)  Resp 12  Ht 6\' 2"  (1.88 m)  Wt 90.3 kg (199 lb 1.2 oz)  BMI 25.55 kg/m2  SpO2 75%   PPS: 10%  No intake or output data in the 24 hours ending 2015/01/13 1015     Physical Exam:  General: NAD, unresponsive, moaning HEENT: Temporal muscle wasting, moist mucous membranes, terminal secretions - gurgling is improved Chest: Episodes of apnea worsening, shallow breathes CVS: RRR Abdomen: Soft, NT, ND, +BS Ext: Bilat feet 1+ edema Neuro: Unresponsive    Labs: CBC    Component Value Date/Time   WBC 15.6* 12/11/2014 0420   RBC 3.54* 12/11/2014 0420   HGB 10.4* 12/11/2014 0420   HCT 31.8* 12/11/2014 0420   PLT 277 12/11/2014 0420   MCV 89.8 12/11/2014 0420   MCH 29.4 12/11/2014 0420   MCHC 32.7 12/11/2014 0420   RDW 17.1* 12/11/2014 0420   LYMPHSABS 3.7 12/02/2014 1619   MONOABS 0.7  12/12/2014 1619   EOSABS 0.3 12/11/2014 1619   BASOSABS 0.1 11/27/2014 1619    BMET    Component Value Date/Time   NA 139 12/11/2014 0420   K 4.2 12/11/2014 0420   CL 112 12/11/2014 0420   CO2 21 12/11/2014 0420   GLUCOSE 129* 12/11/2014 0420   BUN 37* 12/11/2014 0420   CREATININE 2.33* 12/11/2014 0420   CALCIUM 8.2* 12/11/2014 0420   GFRNONAA 24* 12/11/2014 0420   GFRAA 28* 12/11/2014 0420    CMP     Component Value Date/Time   NA 139 12/11/2014 0420   K 4.2 12/11/2014 0420   CL 112 12/11/2014 0420   CO2 21 12/11/2014 0420   GLUCOSE 129* 12/11/2014 0420   BUN 37* 12/11/2014 0420   CREATININE 2.33* 12/11/2014 0420   CALCIUM 8.2* 12/11/2014 0420   GFRNONAA 24* 12/11/2014 0420   GFRAA 28* 12/11/2014 0420    Assessment and Plan: 1. Code Status: DNR 2. Symptom Control: 1. Anxiety/Agitation: Lorazepam 1 mg every 4 hours - may increase to 2 mg if 1 mg is not effective. Lorazepam 1 mg every 2 hours prn.  2. Pain/dyspnea: Morphine gtt is at 12.5 mg/hr. Morphine bolus for 2 mg every 15 min prn.  3. Terminal Secretions: Scopolamine patch. Atropine SL every 4 hours.  3. Psycho/Social: Emotional support provided to family at bedside.  4. Spiritual: Chaplain  following for continued family emotional support and comfort.  5. Disposition: Anticipate hospital death.     Time In Time Out Total Time Spent with Patient Total Overall Time  0930 0950 4min 52min    Greater than 50%  of this time was spent counseling and coordinating care related to the above assessment and plan.  Vinie Sill, NP Palliative Medicine Team Pager # [phone] (M-F 8a-5p) Team Phone # [phone] (Nights/Weekends)

## 2014-12-21 DEATH — deceased

## 2014-12-24 NOTE — Discharge Summary (Signed)
Discharge Summary   Patient ID: [name] MRN: [id number], DOB/AGE: April 16, 1930 81 y.o. Admit date: 12/07/2014 D/C date:     12/24/2014  Primary Cardiologist: Dr. Irish Lack  Principal Problem:   Cardiac arrest Active Problems:   Acute respiratory failure   Atrial fibrillation with rapid ventricular response   Pneumonia   Respiratory failure requiring intubation   Acute kidney injury   Ascending aortic aneurysm   Comatose   Palliative care encounter   Dyspnea and respiratory abnormality   Restlessness and agitation    Admission Dates: 11/29/2014-12/19/14 Discharge Diagnosis: cardiac arrest- eventually he was placed on palliative care and passed while at [facility] on 12-25-2014.  HPI: [name] is a 81 y.o. male with a history of HTN, hyperlipidemia, CAD, and CKD stage 3. He had cardiopulmonary arrest on 11/21/2014 after visiting his wife (a patient here at [facility]) requiring ~20 minutes of ACLS to ROSC. He remained comatose since then, significantly impaired LVEF, and multiorgan failure. The patient was transferred to palliative care unit and made DNR on 12/12/14. He was started on morphine drip and scopolamine patch. He appeared very comfortable and family remained at bedside. Family understood the his condition and wished for comfort care only. He peacefully passed on 2014-12-25.    Discharge Vitals: Blood pressure 83/32, pulse 77, temperature 102 F (38.9 C), temperature source Axillary, resp. rate 12, height 6\' 2"  (1.88 m), weight 199 lb 1.2 oz (90.3 kg), SpO2 75 %.  Labs: Lab Results  Component Value Date   WBC 15.6* 12/11/2014   HGB 10.4* 12/11/2014   HCT 31.8* 12/11/2014   MCV 89.8 12/11/2014   PLT 277 12/11/2014       Diagnostic Studies/Procedures   Dg Chest Port 1 View  12/11/2014   CLINICAL DATA:  Shortness of breath.  EXAM: PORTABLE CHEST - 1 VIEW  COMPARISON:  12/10/2014.  FINDINGS: Endotracheal tube and NG tube in stable position. Right IJ line stable  position. Stable cardiomegaly. Stable mild basilar atelectasis. No pleural effusion or pneumothorax. Prior cervical spine fusion.  IMPRESSION: 1. Lines and tubes in stable position. 2. Stable bibasilar atelectasis. 3. Stable cardiomegaly.  No pulmonary venous congestion.   Electronically Signed   By: Marcello Moores  Register   On: 12/11/2014 07:25   Dg Chest Port 1 View  12/10/2014   CLINICAL DATA:  Acute respiratory failure intubated  EXAM: PORTABLE CHEST - 1 VIEW  COMPARISON:  12/09/2014  FINDINGS: Endotracheal tube and NG tube are unchanged. Central venous line is unchanged. Stable cardiac silhouette. There is mild basilar atelectasis. No pulmonary edema or infiltrate. No pneumothorax.  IMPRESSION: 1. Stable support apparatus. 2. Mild basilar atelectasis. 3. No change from prior.   Electronically Signed   By: Suzy Bouchard M.D.   On: 12/10/2014 07:13   Dg Chest Port 1 View  12/09/2014   CLINICAL DATA:  Difficulty breathing/hypoxia  EXAM: PORTABLE CHEST - 1 VIEW  COMPARISON:  December 08, 2014  FINDINGS: Endotracheal tube tip is 5.7 cm above the carina. Central catheter tip is in the superior vena cava. Nasogastric tube tip and side port in stomach. No pneumothorax. There is a focal area of left base atelectasis. Elsewhere lungs are clear. Heart is upper normal in size with pulmonary vascularity within normal limits. No adenopathy. No bone lesions.  IMPRESSION: Tube and catheter positions as described without pneumothorax. Atelectasis left base. This finding may represent early pneumonia. Elsewhere lungs clear. No change in cardiac silhouette.   Electronically Signed  By: Lowella Grip M.D.   On: 12/09/2014 07:01   Dg Chest Port 1 View  12/17/2014   CLINICAL DATA:  Respiratory failure.  Intubated.  EXAM: PORTABLE CHEST - 1 VIEW  COMPARISON:  Portable film earlier today.  FINDINGS: Endotracheal tube has been advanced and now lies 16 mm above the carina. The heart remains mildly enlarged. No focal lung  opacities. RIGHT IJ catheter tip cavoatrial junction. No pneumothorax.  IMPRESSION: Satisfactory ETT placement.  No active cardiopulmonary disease.   Electronically Signed   By: Rolla Flatten M.D.   On: 12/02/2014 19:37   Dg Chest Portable 1 View  11/28/2014   CLINICAL DATA:  Difficult intubation  EXAM: PORTABLE CHEST - 1 VIEW  COMPARISON:  12/10/2014  FINDINGS: Endotracheal tube at the thoracic inlet.  Cardiomegaly with pulmonary vascular congestion. Increased interstitial markings, favored to be chronic. Mild interstitial edema is possible. No pleural effusion or pneumothorax.  Right IJ venous catheter terminates in the lower SVC.  IMPRESSION: Endotracheal tube at the thoracic inlet.  Right IJ venous catheter terminates in the lower SVC. No pneumothorax.   Electronically Signed   By: Julian Hy M.D.   On: 11/27/2014 17:41   Dg Chest Portable 1 View  12/07/2014   CLINICAL DATA:  Cardiac arrest.  EXAM: PORTABLE CHEST - 1 VIEW  COMPARISON:  None.  FINDINGS: Cardiomegaly. Mild vascular congestion. Telemetry leads overlie the chest. Prior cervical fusion. No effusion or pneumothorax.  IMPRESSION: Cardiomegaly with mild vascular congestion.   Electronically Signed   By: Rolla Flatten M.D.   On: 11/23/2014 17:17   Dg Abd Portable 1v  11/24/2014   CLINICAL DATA:  Nasogastric tube placement  EXAM: PORTABLE ABDOMEN - 1 VIEW  COMPARISON:  None.  FINDINGS: Nasogastric tube tip lies in the stomach. Visualized bowel gas unremarkable.  IMPRESSION: NG tube tip in the stomach.   Electronically Signed   By: Rolla Flatten M.D.   On: 11/29/2014 19:38    Discharge Medications     Medication List    Notice    You have not been prescribed any medications.      Disposition       Duration of Discharge Encounter: Greater than 30 minutes including physician and PA time.  SignedAngelena Form R PA-C 12/24/2014, 10:12 AM  Patient passed away receiving comfort care.  Jettie Booze, MD

## 2014-12-25 ENCOUNTER — Telehealth: Payer: Self-pay | Admitting: Interventional Cardiology

## 2014-12-25 NOTE — Telephone Encounter (Signed)
Original D/C today on this pt, Dr Irish Lack signed mailed to  [address] [place], [place] [address]  1.5.16/km

## 2015-02-25 IMAGING — CR DG CHEST 1V PORT
1 series · 1 of 1 positions shown · non-contrast
Comparison: 12/10/2014.

CLINICAL DATA: Shortness of breath.

EXAM:
PORTABLE CHEST - 1 VIEW

[AP]
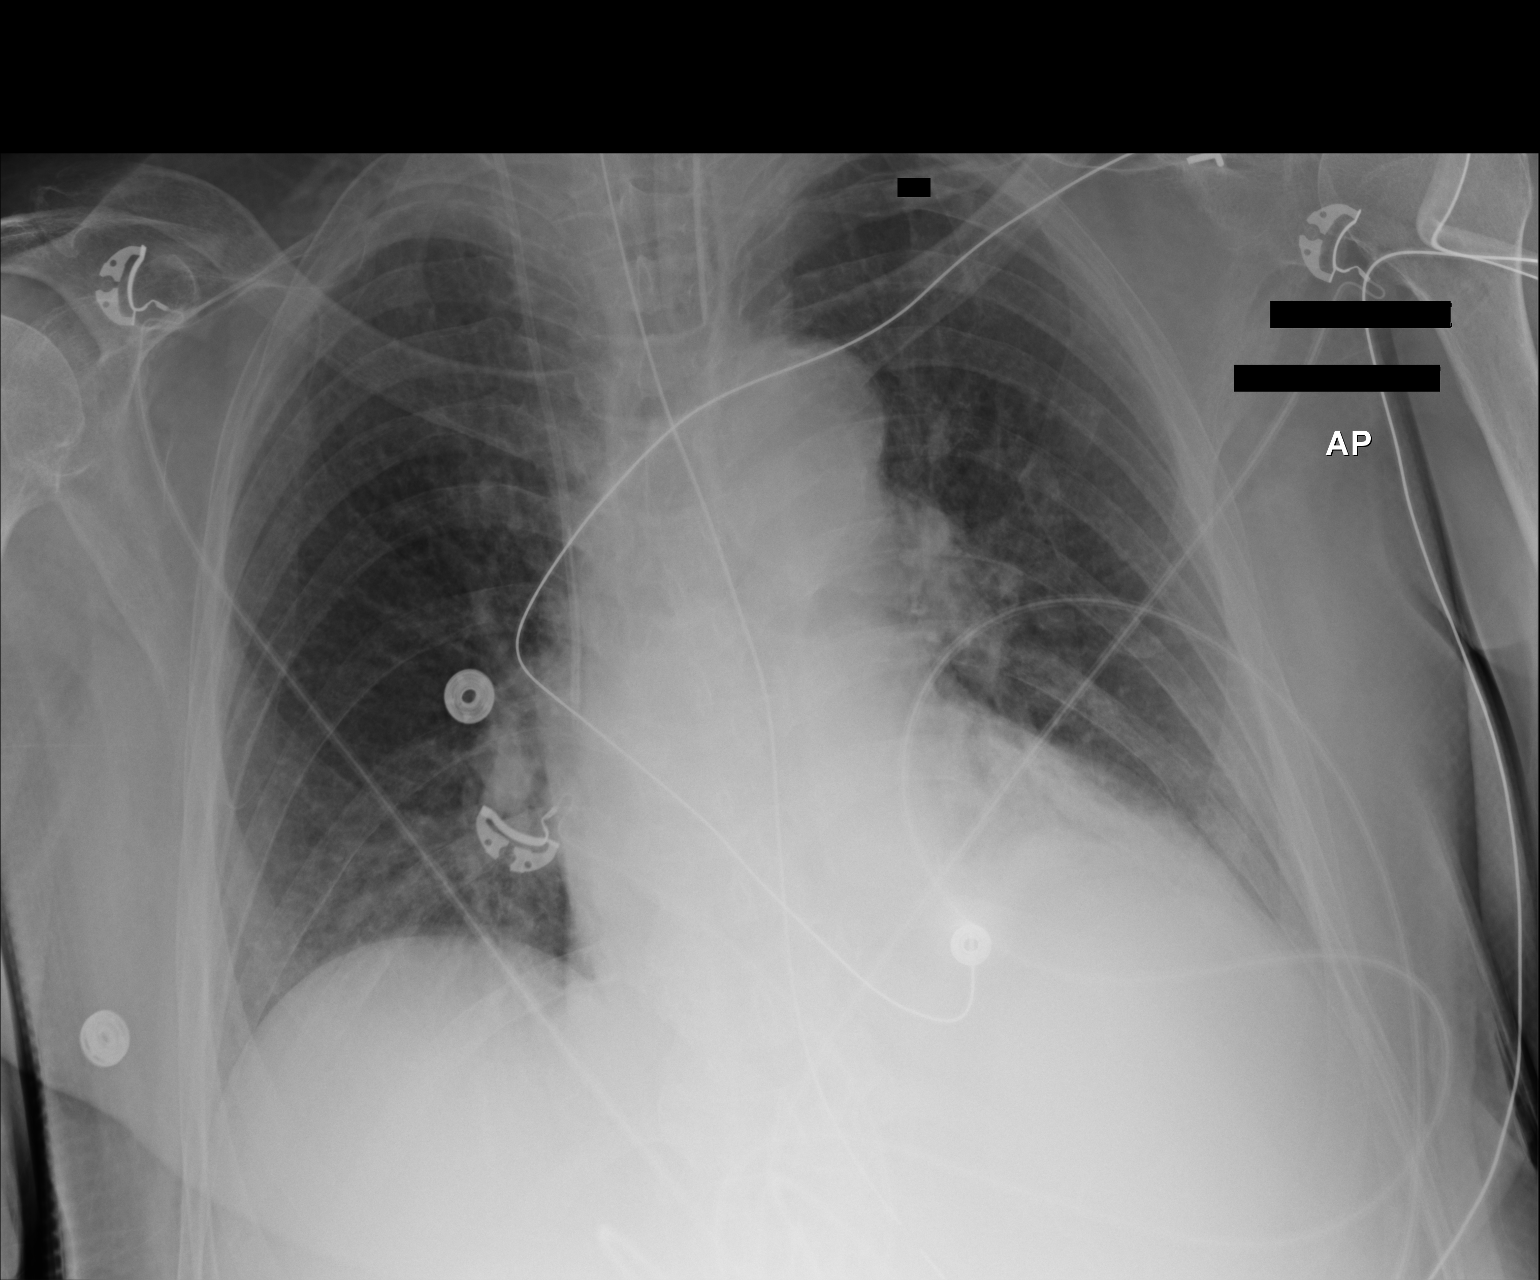

[1 of 1 positions shown; findings below may reference images not displayed]

FINDINGS: Endotracheal tube and NG tube in stable position. Right IJ line
stable position. Stable cardiomegaly. Stable mild basilar
atelectasis. No pleural effusion or pneumothorax. Prior cervical
spine fusion.
IMPRESSION: 1. Lines and tubes in stable position.
2. Stable bibasilar atelectasis.
3. Stable cardiomegaly.  No pulmonary venous congestion.
